# Patient Record
Sex: Female | Born: 1955 | Race: Black or African American | Hispanic: No | Marital: Married | Smoking: Never smoker
Health system: Southern US, Community
[De-identification: ages and names within clinical notes are randomized; demographics above are authoritative.]

## PROBLEM LIST (undated history)

## (undated) DIAGNOSIS — R112 Nausea with vomiting, unspecified: Secondary | ICD-10-CM

## (undated) DIAGNOSIS — M1990 Unspecified osteoarthritis, unspecified site: Secondary | ICD-10-CM

## (undated) DIAGNOSIS — N809 Endometriosis, unspecified: Secondary | ICD-10-CM

## (undated) DIAGNOSIS — K219 Gastro-esophageal reflux disease without esophagitis: Secondary | ICD-10-CM

## (undated) DIAGNOSIS — I341 Nonrheumatic mitral (valve) prolapse: Secondary | ICD-10-CM

## (undated) DIAGNOSIS — Z9889 Other specified postprocedural states: Secondary | ICD-10-CM

## (undated) DIAGNOSIS — I10 Essential (primary) hypertension: Secondary | ICD-10-CM

## (undated) DIAGNOSIS — D649 Anemia, unspecified: Secondary | ICD-10-CM

## (undated) HISTORY — DX: Essential (primary) hypertension: I10

## (undated) HISTORY — PX: TUBAL LIGATION: SHX77

## (undated) HISTORY — PX: COLONOSCOPY: SHX174

## (undated) HISTORY — PX: KNEE ARTHROSCOPY W/ MENISCAL REPAIR: SHX1877

## (undated) HISTORY — DX: Nonrheumatic mitral (valve) prolapse: I34.1

## (undated) HISTORY — PX: BUNIONECTOMY: SHX129

---

## 1978-03-27 HISTORY — PX: LEG SURGERY: SHX1003

## 1998-02-08 ENCOUNTER — Other Ambulatory Visit: Admission: RE | Admit: 1998-02-08 | Discharge: 1998-02-08 | Payer: Self-pay | Admitting: Obstetrics and Gynecology

## 1998-05-28 ENCOUNTER — Other Ambulatory Visit: Admission: RE | Admit: 1998-05-28 | Discharge: 1998-05-28 | Payer: Self-pay | Admitting: Obstetrics and Gynecology

## 1998-06-28 ENCOUNTER — Ambulatory Visit (HOSPITAL_COMMUNITY): Admission: RE | Admit: 1998-06-28 | Discharge: 1998-06-28 | Payer: Self-pay | Admitting: Obstetrics and Gynecology

## 2000-09-12 ENCOUNTER — Encounter: Admission: RE | Admit: 2000-09-12 | Discharge: 2000-09-12 | Payer: Self-pay | Admitting: Obstetrics and Gynecology

## 2000-09-12 ENCOUNTER — Encounter: Payer: Self-pay | Admitting: Obstetrics and Gynecology

## 2000-09-24 ENCOUNTER — Other Ambulatory Visit: Admission: RE | Admit: 2000-09-24 | Discharge: 2000-09-24 | Payer: Self-pay | Admitting: Obstetrics and Gynecology

## 2000-10-02 ENCOUNTER — Ambulatory Visit (HOSPITAL_COMMUNITY): Admission: RE | Admit: 2000-10-02 | Discharge: 2000-10-02 | Payer: Self-pay | Admitting: Obstetrics and Gynecology

## 2001-03-27 HISTORY — PX: PARTIAL KNEE ARTHROPLASTY: SHX2174

## 2001-06-06 ENCOUNTER — Encounter: Payer: Self-pay | Admitting: Family Medicine

## 2001-06-06 ENCOUNTER — Encounter: Admission: RE | Admit: 2001-06-06 | Discharge: 2001-06-06 | Payer: Self-pay | Admitting: Family Medicine

## 2001-07-15 ENCOUNTER — Encounter: Payer: Self-pay | Admitting: Family Medicine

## 2001-07-15 ENCOUNTER — Encounter: Admission: RE | Admit: 2001-07-15 | Discharge: 2001-07-15 | Payer: Self-pay | Admitting: Family Medicine

## 2003-04-03 ENCOUNTER — Encounter: Admission: RE | Admit: 2003-04-03 | Discharge: 2003-04-03 | Payer: Self-pay | Admitting: Obstetrics and Gynecology

## 2005-09-13 ENCOUNTER — Encounter: Admission: RE | Admit: 2005-09-13 | Discharge: 2005-09-13 | Payer: Self-pay | Admitting: Obstetrics and Gynecology

## 2006-12-10 ENCOUNTER — Encounter: Admission: RE | Admit: 2006-12-10 | Discharge: 2006-12-10 | Payer: Self-pay | Admitting: Obstetrics and Gynecology

## 2007-07-24 ENCOUNTER — Encounter: Admission: RE | Admit: 2007-07-24 | Discharge: 2007-07-24 | Payer: Self-pay | Admitting: Internal Medicine

## 2008-02-11 ENCOUNTER — Encounter: Admission: RE | Admit: 2008-02-11 | Discharge: 2008-02-11 | Payer: Self-pay | Admitting: Obstetrics and Gynecology

## 2009-03-15 ENCOUNTER — Encounter: Admission: RE | Admit: 2009-03-15 | Discharge: 2009-03-15 | Payer: Self-pay | Admitting: Obstetrics and Gynecology

## 2010-08-12 NOTE — Op Note (Signed)
[facility]  Patient:    Carol [name], Carol [name]                   MRN: [id number] Proc. Date: 10/02/00 Adm. Date:  [id number] Attending:  Malon Kindle                           Operative Report  PREOPERATIVE DIAGNOSIS:       Voluntary sterilization.  POSTOPERATIVE DIAGNOSES:      1. Voluntary sterilization.                               2. Pelvic endometriosis.  OPERATION:                    Open laparoscopic tubal cauterization.  SURGEON:                      Malachi Pro. Ambrose Mantle, M.D.  ANESTHESIA:                   General.  DESCRIPTION OF PROCEDURE:     The patient was brought to the operating room and placed under satisfactory general anesthesia, and placed in Allen stirrups.  The abdomen, vulva, vagina, and urethra were prepped with Betadine solution.  The bladder was emptied with a Jamaica catheter.  The examination revealed the uterus to be anterior, upper limits of normal size.  The adnexa were free of masses.  A Hulka cannula was placed into the uterus and attached to the anterior cervical lip.  The abdomen was then draped as a sterile field. A semilunar incision was made in the umbilicus and carried in layers down to the fascia.  The fascia was elevated with Kocher clamps.  It was incised, and I saw that I was in the peritoneal cavity through a little pin hole in the peritoneum.  I opened this incision more.  I placed two sutures of #0 Vicryl using a urology needle through the fascia, starting at 12 oclock, coming around from 12 to 6 clockwise, and then going from 12 to 6 counterclockwise. I then placed the Hasson cannula into the peritoneal cavity and secured the sutures down to the Hasson cannula.  Then with high flow, filled the abdominal cavity with CO2.  I then placed a smaller trocar suprapubically under direct vision, inserted the blunt probe, and elevated the uterus into the operative field, and made the following findings:   The uterus was anterior, upper limits of normal size, without obvious abnormality.  The posterior cul-de-sac was somewhat distorted but no actual lesions were noted in the cul-de-sac.  To the right of the cul-de-sac was an area close to the uterosacral ligament that was involved with black endometriosis.  Further lateral on the posterior broad ligament was probably a 4.0 mm area of solid black pigment, suggestive of endometriosis.  There was also a clear raised area above the initially described area of endometriosis that was probably secondary to endometriosis. Lateral to the left uterosacral ligament was some red endometriosis.  There were no adhesions here.  The anterior cul-de-sac was basically normal.  The right tube and ovary, and the left tube and ovary were normal.  I inspected the upper abdomen.  The liver was smooth.  I did not see any abnormalities.  I did not actually see the gallbladder.  I went across the  liver.  Both lobes appeared normal.  I then turned my attention to the tubes, and grasped the midportion of the right tube and cauterized it in three or four consecutive locations, going toward the uterine cornu until the meter always read zero.  This same procedure was conducted on the left tube.  At this point, I reinspected the pelvic cavity and found no problems.  I took out the lower abdominal trocar. There was no bleeding from the site.  I then released the pneumoperitoneum and removed the Hasson cannula and pulled each #0 Vicryl tight, and tied it to itself, and then tied across the midline to each other.  This closed the abdominal cavity.  I then used a #3-0 Vicryl to close the subcutaneous tissue of the periumbilical incision, and then closed the incision skin with #3-0 plain catgut.  I closed the lower abdominal incision with Steri-Strips.  The patient seemed to tolerate the procedure well.  Blood loss was less than 5 cc.  The sponge and needle counts were  correct.  The Hulka cannula was removed.  The patient was returned to the recovery room in satisfactory condition. DD:  10/02/00 TD:  10/02/00 Job: [id number]

## 2011-02-07 ENCOUNTER — Other Ambulatory Visit: Payer: Self-pay | Admitting: Obstetrics and Gynecology

## 2011-02-07 DIAGNOSIS — Z1231 Encounter for screening mammogram for malignant neoplasm of breast: Secondary | ICD-10-CM

## 2011-03-07 ENCOUNTER — Ambulatory Visit
Admission: RE | Admit: 2011-03-07 | Discharge: 2011-03-07 | Disposition: A | Discharge status: Home / Self Care | Payer: BC Managed Care – PPO | Source: Ambulatory Visit | Attending: Obstetrics and Gynecology | Admitting: Obstetrics and Gynecology

## 2011-03-07 DIAGNOSIS — Z1231 Encounter for screening mammogram for malignant neoplasm of breast: Secondary | ICD-10-CM

## 2011-11-21 ENCOUNTER — Ambulatory Visit (INDEPENDENT_AMBULATORY_CARE_PROVIDER_SITE_OTHER): Payer: BC Managed Care – PPO | Admitting: Family Medicine

## 2011-11-21 ENCOUNTER — Encounter: Payer: Self-pay | Admitting: Family Medicine

## 2011-11-21 VITALS — BP 124/88 | HR 87 | Ht 66.0 in | Wt 218.0 lb

## 2011-11-21 DIAGNOSIS — M1710 Unilateral primary osteoarthritis, unspecified knee: Secondary | ICD-10-CM

## 2011-11-21 DIAGNOSIS — M25562 Pain in left knee: Secondary | ICD-10-CM

## 2011-11-21 NOTE — Progress Notes (Signed)
  Subjective:    Patient ID: Carol [name], female    DOB: 06-10-1955, 56 y.o.   MRN: [id number]  PCP: Milus Height  HPI 56 yo F here for left knee pain.  Patient reports over 15 years of left knee pain. She previously has had left knee arthroscopy followed by partial knee replacement (she's unsure which compartment) then 2 more arthroscopies for partial meniscectomies. She's tried several things for her arthritis - cortisone shots, naproxen, celebrex, biofreeze, other natural topicals, advil, relative rest. Has never had viscosupplementation.   Has had discussion regarding total knee replacement but patient would like to exhaust all options before considering this.  Past Medical History  Diagnosis Date  . Hypertension   . MVP (mitral valve prolapse)     Current Outpatient Prescriptions on File Prior to Visit  Medication Sig Dispense Refill  . quinapril-hydrochlorothiazide (ACCURETIC) 10-12.5 MG per tablet         History reviewed. No pertinent past surgical history.  No Known Allergies  History   Social History  . Marital Status: Married    Spouse Name: N/A    Number of Children: N/A  . Years of Education: N/A   Occupational History  . Not on file.   Social History Main Topics  . Smoking status: Never Smoker   . Smokeless tobacco: Not on file  . Alcohol Use: Not on file  . Drug Use: Not on file  . Sexually Active: Not on file   Other Topics Concern  . Not on file   Social History Narrative  . No narrative on file    Family History  Problem Relation Age of Onset  . Hypertension Father   . Hyperlipidemia Neg Hx   . Sudden death Neg Hx   . Diabetes Other   . Heart attack Other   . Hypertension Other     BP 124/88  Pulse 87  Ht 5\' 6"  (1.676 m)  Wt 218 lb (98.884 kg)  BMI 35.19 kg/m2  Review of Systems See HPI above.    Objective:   Physical Exam Gen: NAD  L knee: Well healed surgical and port scars.  No effusion, ecchymoses, other  deformity. 1+ crepitus. Mild medial joint line TTP.  No lateral joint line post patellar facet, other knee TTP. ROM 0 - 120 degrees. Negative ant/post drawers. Negative valgus/varus testing. Negative lachmanns. Negative mcmurrays, apleys, patellar apprehension, clarkes. NV intact distally.  R knee: FROM without pain, instability.    Assessment & Plan:  1. 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  Discussed her nonsurgical options.  A few things she hasn't tried include viscosupplementation, capsaicin, glucosamine.  Discussed risks and benefits with these and she would like to try all of them.  See instructions for further regarding other treatments discussed.  After informed written consent, patient was seated on exam table. Left knee was prepped with alcohol swab and utilizing anterolateral approach, patient's left knee was injected intraarticularly with 3 mL marcaine and supartz. Patient tolerated the procedure well without immediate complications.

## 2011-11-21 NOTE — Patient Instructions (Addendum)
Tylenol 500mg  1-2 tabs three times a day for pain. Aleve 1-2 tabs twice a day with food *Glucosamine sulfate 750mg  twice a day is a supplement that may help moderate to severe arthritis. *Capsaicin topically up to four times a day may also help with pain. Cortisone injections are an option. *If cortisone injections do not help, there are different types of shots that may help but they take longer to take effect (viscosupplementation). It's important that you continue to stay active. Consider physical therapy to strengthen muscles around the joint that hurts to take pressure off of the joint itself. Shoe inserts with good arch support may be helpful. Walker or cane if needed. Heat or ice 15 minutes at a time 3-4 times a day as needed to help with pain. Water aerobics and cycling with low resistance are the best two types of exercise for arthritis. A knee brace may be helpful if your knee feels unstable due to pain. We will call you when the injection materials come in - you will need to come in every week for 3 weeks to get these.

## 2011-11-21 NOTE — Assessment & Plan Note (Signed)
2/2 DJD.  Discussed her nonsurgical options.  A few things she hasn't tried include viscosupplementation, capsaicin, glucosamine.  Discussed risks and benefits with these and she would like to try all of them.  See instructions for further regarding other treatments discussed.  After informed written consent, patient was seated on exam table. Left knee was prepped with alcohol swab and utilizing anterolateral approach, patient's left knee was injected intraarticularly with 3 mL marcaine and supartz. Patient tolerated the procedure well without immediate complications.

## 2011-11-28 ENCOUNTER — Ambulatory Visit (INDEPENDENT_AMBULATORY_CARE_PROVIDER_SITE_OTHER): Payer: BC Managed Care – PPO | Admitting: Family Medicine

## 2011-11-28 DIAGNOSIS — M25562 Pain in left knee: Secondary | ICD-10-CM

## 2011-11-28 DIAGNOSIS — M1710 Unilateral primary osteoarthritis, unspecified knee: Secondary | ICD-10-CM

## 2011-11-28 DIAGNOSIS — M25569 Pain in unspecified knee: Secondary | ICD-10-CM

## 2011-11-29 ENCOUNTER — Encounter: Payer: Self-pay | Admitting: Family Medicine

## 2011-11-29 NOTE — Progress Notes (Signed)
  Subjective:    Patient ID: Carol [name], female    DOB: 15-Mar-1956, 56 y.o.   MRN: [id number]  PCP: Milus Height  HPI  56 yo F here for f/u left knee pain.  8/27: Patient reports over 15 years of left knee pain. She previously has had left knee arthroscopy followed by partial knee replacement (she's unsure which compartment) then 2 more arthroscopies for partial meniscectomies. She's tried several things for her arthritis - cortisone shots, naproxen, celebrex, biofreeze, other natural topicals, advil, relative rest. Has never had viscosupplementation.   Has had discussion regarding total knee replacement but patient would like to exhaust all options before considering this.  9/3: Patient reports not much improvement since last visit. Returns today for second supartz injection. Brought radiographs from 2010 - showed she had a partial replacement of medial compartment of left knee - only a femoral component visualized on radiograph.  Past Medical History  Diagnosis Date  . Hypertension   . MVP (mitral valve prolapse)     Current Outpatient Prescriptions on File Prior to Visit  Medication Sig Dispense Refill  . amoxicillin (AMOXIL) 500 MG capsule       . quinapril-hydrochlorothiazide (ACCURETIC) 10-12.5 MG per tablet         History reviewed. No pertinent past surgical history.  No Known Allergies  History   Social History  . Marital Status: Married    Spouse Name: N/A    Number of Children: N/A  . Years of Education: N/A   Occupational History  . Not on file.   Social History Main Topics  . Smoking status: Never Smoker   . Smokeless tobacco: Not on file  . Alcohol Use: Not on file  . Drug Use: Not on file  . Sexually Active: Not on file   Other Topics Concern  . Not on file   Social History Narrative  . No narrative on file    Family History  Problem Relation Age of Onset  . Hypertension Father   . Hyperlipidemia Neg Hx   . Sudden death Neg Hx     . Diabetes Other   . Heart attack Other   . Hypertension Other     There were no vitals taken for this visit.  Review of Systems  See HPI above.    Objective:   Physical Exam  Gen: NAD  L knee: Well healed surgical and port scars.  No effusion, ecchymoses, other deformity. 1+ crepitus. Mild medial joint line TTP.  No lateral joint line post patellar facet, other knee TTP. ROM 0 - 120 degrees.  R knee: FROM without pain, instability.    Assessment & Plan:  1. 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  Second supartz given today.  Taking capsaicin, glucosamine.  Return in 1 week for 3rd injection.  After informed written consent, patient was seated on exam table. Left knee was prepped with alcohol swab and utilizing anterolateral approach, patient's left knee was injected intraarticularly with 3 mL marcaine and supartz. Patient tolerated the procedure well without immediate complications.

## 2011-11-29 NOTE — Assessment & Plan Note (Signed)
2/2 DJD.  Second supartz given today.  Taking capsaicin, glucosamine.  Return in 1 week for 3rd injection.  After informed written consent, patient was seated on exam table. Left knee was prepped with alcohol swab and utilizing anterolateral approach, patient's left knee was injected intraarticularly with 3 mL marcaine and supartz. Patient tolerated the procedure well without immediate complications.

## 2011-12-05 ENCOUNTER — Ambulatory Visit (INDEPENDENT_AMBULATORY_CARE_PROVIDER_SITE_OTHER): Payer: BC Managed Care – PPO | Admitting: Family Medicine

## 2011-12-05 ENCOUNTER — Encounter: Payer: Self-pay | Admitting: Family Medicine

## 2011-12-05 VITALS — BP 119/86 | HR 103 | Ht 66.0 in | Wt 214.0 lb

## 2011-12-05 DIAGNOSIS — M25569 Pain in unspecified knee: Secondary | ICD-10-CM

## 2011-12-05 DIAGNOSIS — M25562 Pain in left knee: Secondary | ICD-10-CM

## 2011-12-05 DIAGNOSIS — M1710 Unilateral primary osteoarthritis, unspecified knee: Secondary | ICD-10-CM

## 2011-12-05 DIAGNOSIS — IMO0002 Reserved for concepts with insufficient information to code with codable children: Secondary | ICD-10-CM

## 2011-12-05 DIAGNOSIS — M179 Osteoarthritis of knee, unspecified: Secondary | ICD-10-CM

## 2011-12-08 ENCOUNTER — Encounter: Payer: Self-pay | Admitting: Family Medicine

## 2011-12-08 DIAGNOSIS — M1710 Unilateral primary osteoarthritis, unspecified knee: Secondary | ICD-10-CM | POA: Insufficient documentation

## 2011-12-08 NOTE — Progress Notes (Signed)
  Subjective:    Patient ID: Carol [name], female    DOB: [birth year], 56 y.o.   MRN: [id number]  PCP: Milus Height  HPI  56 yo F here for f/u left knee pain.  8/27: Patient reports over 15 years of left knee pain. She previously has had left knee arthroscopy followed by partial knee replacement (she's unsure which compartment) then 2 more arthroscopies for partial meniscectomies. She's tried several things for her arthritis - cortisone shots, naproxen, celebrex, biofreeze, other natural topicals, advil, relative rest. Has never had viscosupplementation.   Has had discussion regarding total knee replacement but patient would like to exhaust all options before considering this.  9/3: Patient reports not much improvement since last visit. Returns today for second supartz injection. Brought radiographs from 2010 - showed she had a partial replacement of medial compartment of left knee - only a femoral component visualized on radiograph.  9/10: Feels about the same - maybe a little more pain since last visit. Here for final injection.  Past Medical History  Diagnosis Date  . Hypertension   . MVP (mitral valve prolapse)     Current Outpatient Prescriptions on File Prior to Visit  Medication Sig Dispense Refill  . amoxicillin (AMOXIL) 500 MG capsule       . quinapril-hydrochlorothiazide (ACCURETIC) 10-12.5 MG per tablet         History reviewed. No pertinent past surgical history.  No Known Allergies  History   Social History  . Marital Status: Married    Spouse Name: N/A    Number of Children: N/A  . Years of Education: N/A   Occupational History  . Not on file.   Social History Main Topics  . Smoking status: Never Smoker   . Smokeless tobacco: Not on file  . Alcohol Use: Not on file  . Drug Use: Not on file  . Sexually Active: Not on file   Other Topics Concern  . Not on file   Social History Narrative  . No narrative on file    Family History    Problem Relation Age of Onset  . Hypertension Father   . Hyperlipidemia Neg Hx   . Sudden death Neg Hx   . Diabetes Other   . Heart attack Other   . Hypertension Other     BP 119/86  Pulse 103  Ht 5\' 6"  (1.676 m)  Wt 214 lb (97.07 kg)  BMI 34.54 kg/m2  Review of Systems  See HPI above.    Objective:   Physical Exam  Gen: NAD  L knee: Well healed surgical and port scars.  No effusion, ecchymoses, other deformity. 1+ crepitus. Mild medial joint line TTP.  No lateral joint line post patellar facet, other knee TTP. ROM 0 - 120 degrees.  R knee: FROM without pain, instability.    Assessment & Plan:  1. 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  Third supartz given today.  Taking capsaicin, glucosamine.  F/u in 1 month for reevaluation.  If moderate benefit would consider fourth and fifth injections.  If no benefit would consider orthopedic surgeon referral to discuss TKR.  After informed written consent, patient was seated on exam table. Left knee was prepped with alcohol swab and utilizing anterolateral approach, patient's left knee was injected intraarticularly with 3 mL marcaine and supartz. Patient tolerated the procedure well without immediate complications.

## 2011-12-08 NOTE — Assessment & Plan Note (Signed)
2/2 DJD.  Third supartz given today.  Taking capsaicin, glucosamine.  F/u in 1 month for reevaluation.  If moderate benefit would consider fourth and fifth injections.  If no benefit would consider orthopedic surgeon referral to discuss TKR.  After informed written consent, patient was seated on exam table. Left knee was prepped with alcohol swab and utilizing anterolateral approach, patient's left knee was injected intraarticularly with 3 mL marcaine and supartz. Patient tolerated the procedure well without immediate complications.

## 2012-01-04 ENCOUNTER — Ambulatory Visit (INDEPENDENT_AMBULATORY_CARE_PROVIDER_SITE_OTHER): Payer: BC Managed Care – PPO | Admitting: Family Medicine

## 2012-01-04 ENCOUNTER — Encounter: Payer: Self-pay | Admitting: Family Medicine

## 2012-01-04 VITALS — BP 118/86 | HR 86 | Ht 66.0 in | Wt 211.0 lb

## 2012-01-04 DIAGNOSIS — M25569 Pain in unspecified knee: Secondary | ICD-10-CM

## 2012-01-04 DIAGNOSIS — M25562 Pain in left knee: Secondary | ICD-10-CM

## 2012-01-04 MED ORDER — MELOXICAM 15 MG PO TABS: 15.0000 mg | ORAL_TABLET | Freq: Every day | ORAL | Status: DC

## 2012-01-05 ENCOUNTER — Encounter: Payer: Self-pay | Admitting: Family Medicine

## 2012-01-05 NOTE — Assessment & Plan Note (Signed)
2/2 DJD.  S/p partial knee replacement (medial compartment).  After supartz series has not had benefit.  Third supartz given today.  At this point she's exhausted conservative measures for her arthritis.  Will refer her to ortho to talk about conversion of partial to total knee replacement.  She would like to see a different surgeon than the one who performed partial replacement.  Will arrange referral.

## 2012-01-05 NOTE — Progress Notes (Signed)
  Subjective:    Patient ID: Carol [name], female    DOB: 1955/09/17, 56 y.o.   MRN: [id number]  PCP: Milus Height  HPI  56 yo F here for f/u left knee pain.  8/27: Patient reports over 15 years of left knee pain. She previously has had left knee arthroscopy followed by partial knee replacement (she's unsure which compartment) then 2 more arthroscopies for partial meniscectomies. She's tried several things for her arthritis - cortisone shots, naproxen, celebrex, biofreeze, other natural topicals, advil, relative rest. Has never had viscosupplementation.   Has had discussion regarding total knee replacement but patient would like to exhaust all options before considering this.  9/3: Patient reports not much improvement since last visit. Returns today for second supartz injection. Brought radiographs from 2010 - showed she had a partial replacement of medial compartment of left knee - only a femoral component visualized on radiograph.  9/10: Feels about the same - maybe a little more pain since last visit. Here for final injection.  10/10: Patient reports that despite injection series of supartz she feels pain is the same as before injections. Not taking anything for pain currently.  Past Medical History  Diagnosis Date  . Hypertension   . MVP (mitral valve prolapse)     Current Outpatient Prescriptions on File Prior to Visit  Medication Sig Dispense Refill  . amoxicillin (AMOXIL) 500 MG capsule       . quinapril-hydrochlorothiazide (ACCURETIC) 10-12.5 MG per tablet         History reviewed. No pertinent past surgical history.  No Known Allergies  History   Social History  . Marital Status: Married    Spouse Name: N/A    Number of Children: N/A  . Years of Education: N/A   Occupational History  . Not on file.   Social History Main Topics  . Smoking status: Never Smoker   . Smokeless tobacco: Not on file  . Alcohol Use: Not on file  . Drug Use: Not on  file  . Sexually Active: Not on file   Other Topics Concern  . Not on file   Social History Narrative  . No narrative on file    Family History  Problem Relation Age of Onset  . Hypertension Father   . Hyperlipidemia Neg Hx   . Sudden death Neg Hx   . Diabetes Other   . Heart attack Other   . Hypertension Other     BP 118/86  Pulse 86  Ht 5\' 6"  (1.676 m)  Wt 211 lb (95.709 kg)  BMI 34.06 kg/m2  Review of Systems  See HPI above.    Objective:   Physical Exam  Gen: NAD Exam not repeated today. L knee: Well healed surgical and port scars.  No effusion, ecchymoses, other deformity. 1+ crepitus. Mild medial joint line TTP.  No lateral joint line post patellar facet, other knee TTP. ROM 0 - 120 degrees.  R knee: FROM without pain, instability.    Assessment & Plan:  1. 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  S/p partial knee replacement (medial compartment).  After supartz series has not had benefit.  Third supartz given today.  At this point she's exhausted conservative measures for her arthritis.  Will refer her to ortho to talk about conversion of partial to total knee replacement.  She would like to see a different surgeon than the one who performed partial replacement.  Will arrange referral.

## 2012-06-26 ENCOUNTER — Other Ambulatory Visit: Payer: Self-pay | Admitting: *Deleted

## 2012-06-26 ENCOUNTER — Other Ambulatory Visit: Payer: Self-pay | Admitting: Family Medicine

## 2012-06-26 DIAGNOSIS — M25562 Pain in left knee: Secondary | ICD-10-CM

## 2012-06-26 MED ORDER — MELOXICAM 15 MG PO TABS: 15.0000 mg | ORAL_TABLET | Freq: Every day | ORAL | Status: DC

## 2012-08-05 ENCOUNTER — Other Ambulatory Visit: Payer: Self-pay | Admitting: Orthopedic Surgery

## 2012-08-12 ENCOUNTER — Other Ambulatory Visit (HOSPITAL_BASED_OUTPATIENT_CLINIC_OR_DEPARTMENT_OTHER): Payer: Self-pay | Admitting: Obstetrics and Gynecology

## 2012-08-12 DIAGNOSIS — Z1231 Encounter for screening mammogram for malignant neoplasm of breast: Secondary | ICD-10-CM

## 2012-08-14 ENCOUNTER — Ambulatory Visit (HOSPITAL_BASED_OUTPATIENT_CLINIC_OR_DEPARTMENT_OTHER)
Admission: RE | Admit: 2012-08-14 | Discharge: 2012-08-14 | Disposition: A | Discharge status: Home / Self Care | Payer: BC Managed Care – PPO | Source: Ambulatory Visit | Attending: Obstetrics and Gynecology | Admitting: Obstetrics and Gynecology

## 2012-08-14 DIAGNOSIS — Z1231 Encounter for screening mammogram for malignant neoplasm of breast: Secondary | ICD-10-CM

## 2012-08-16 NOTE — Pre-Procedure Instructions (Signed)
ASHYRA CANTIN  08/16/2012   Your procedure is scheduled on:  Monday August 26, 2012.  Report to [facility] at 5:30 AM.  Call this number if you have problems the morning of surgery: [phone]   Remember:   Do not eat food or drink liquids after midnight.   Take these medicines the morning of surgery with A SIP OF WATER: Hydrocodone if needed for pain, and Pepcid if needed for heartburn   Do not wear jewelry, make-up or nail polish.  Do not wear lotions, powders, or perfumes.   Do not shave 48 hours prior to surgery.  Do not bring valuables to the hospital.  Contacts, dentures or bridgework may not be worn into surgery.  Leave suitcase in the car. After surgery it may be brought to your room.  For patients admitted to the hospital, checkout time is 11:00 AM the day of discharge.   Patients discharged the day of surgery will not be allowed to drive home.  Name and phone number of your driver: Family/Friend  Special Instructions: Shower using CHG 2 nights before surgery and the night before surgery.  If you shower the day of surgery use CHG.  Use special wash - you have one bottle of CHG for all showers.  You should use approximately 1/3 of the bottle for each shower.   Please read over the following fact sheets that you were given: Pain Booklet, Coughing and Deep Breathing, Blood Transfusion Information, MRSA Information and Surgical Site Infection Prevention

## 2012-08-20 ENCOUNTER — Encounter (HOSPITAL_COMMUNITY)
Admission: RE | Admit: 2012-08-20 | Discharge: 2012-08-20 | Disposition: A | Discharge status: Home / Self Care | Payer: BC Managed Care – PPO | Source: Ambulatory Visit | Attending: Orthopedic Surgery | Admitting: Orthopedic Surgery

## 2012-08-20 ENCOUNTER — Other Ambulatory Visit: Payer: Self-pay | Admitting: Orthopedic Surgery

## 2012-08-20 ENCOUNTER — Encounter (HOSPITAL_COMMUNITY): Payer: Self-pay | Admitting: Pharmacy Technician

## 2012-08-20 ENCOUNTER — Encounter (HOSPITAL_COMMUNITY): Payer: Self-pay

## 2012-08-20 DIAGNOSIS — Z01818 Encounter for other preprocedural examination: Secondary | ICD-10-CM | POA: Insufficient documentation

## 2012-08-20 DIAGNOSIS — Z01810 Encounter for preprocedural cardiovascular examination: Secondary | ICD-10-CM | POA: Insufficient documentation

## 2012-08-20 DIAGNOSIS — Z01812 Encounter for preprocedural laboratory examination: Secondary | ICD-10-CM | POA: Insufficient documentation

## 2012-08-20 DIAGNOSIS — Z01811 Encounter for preprocedural respiratory examination: Secondary | ICD-10-CM | POA: Insufficient documentation

## 2012-08-20 HISTORY — DX: Nausea with vomiting, unspecified: R11.2

## 2012-08-20 HISTORY — DX: Endometriosis, unspecified: N80.9

## 2012-08-20 HISTORY — DX: Gastro-esophageal reflux disease without esophagitis: K21.9

## 2012-08-20 HISTORY — DX: Other specified postprocedural states: Z98.890

## 2012-08-20 HISTORY — DX: Unspecified osteoarthritis, unspecified site: M19.90

## 2012-08-20 LAB — BASIC METABOLIC PANEL
CO2: 26 mEq/L (ref 19–32)
Calcium: 9.8 mg/dL (ref 8.4–10.5)
Creatinine, Ser: 0.82 mg/dL (ref 0.50–1.10)
GFR calc Af Amer: >90 mL/min (ref >90)
Sodium: 142 mEq/L (ref 135–145)

## 2012-08-20 LAB — TYPE AND SCREEN
ABO/RH(D): O POS
Antibody Screen: NEGATIVE

## 2012-08-20 LAB — CBC WITH DIFFERENTIAL/PLATELET
Basophils Absolute: 0 10*3/uL (ref 0.0–0.1)
Lymphocytes Relative: 26 % (ref 12–46)
Lymphs Abs: 1.5 10*3/uL (ref 0.7–4.0)
Neutro Abs: 3.9 10*3/uL (ref 1.7–7.7)
Neutrophils Relative %: 67 % (ref 43–77)
Platelets: 246 10*3/uL (ref 150–400)
RBC: 4.25 MIL/uL (ref 3.87–5.11)
RDW: 12.6 % (ref 11.5–15.5)
WBC: 5.8 10*3/uL (ref 4.0–10.5)

## 2012-08-20 LAB — URINALYSIS, ROUTINE W REFLEX MICROSCOPIC
Hgb urine dipstick: NEGATIVE
Ketones, ur: NEGATIVE mg/dL
Specific Gravity, Urine: 1.028 (ref 1.005–1.030)
Urobilinogen, UA: 1 mg/dL (ref 0.0–1.0)

## 2012-08-20 LAB — PROTIME-INR
INR: 0.99 (ref 0.00–1.49)
Prothrombin Time: 13 seconds (ref 11.6–15.2)

## 2012-08-20 LAB — SURGICAL PCR SCREEN
MRSA, PCR: NEGATIVE
Staphylococcus aureus: NEGATIVE

## 2012-08-20 LAB — URINE MICROSCOPIC-ADD ON

## 2012-08-20 LAB — ABO/RH: ABO/RH(D): O POS

## 2012-08-20 LAB — APTT: aPTT: 31 seconds (ref 24–37)

## 2012-08-20 NOTE — Progress Notes (Signed)
Nurse called and left a voicemail with Olegario Messier at Dr. Wadie Lessen office and informed her that orders were in [facility] however there was no order for consent. Direct call back number left.

## 2012-08-20 NOTE — Progress Notes (Signed)
Patient informed Nurse that she had a stress test at [facility] several years ago, but it was determined it was due to "acid reflux." Will request stress test report. PCP is Elesa Hacker.

## 2012-08-21 LAB — URINE CULTURE: Colony Count: >=100000

## 2012-08-21 NOTE — Progress Notes (Signed)
Anesthesia Chart Review:  Patient is a 57 year old female scheduled for revision of left TKA on 08/26/12 by Dr. Turner Daniels.  History includes obesity, non-smoker, HTN, MVP, GERD, arthritis, endometriosis, post-operative N/V.  PCP is Dr. Maurice Small.  EKG on 08/20/12 showed NSR, cannot rule out anterior infarct (age undetermined).  Currently, there are no comparison EKGs available.  She reported a stress test at [facility]) several years ago (> 5 years per PAT RN) that was okay, and symptoms were felt related to reflux.  Stress, echo, and EKG from [facility] were requested as available, but medical records said none of these studies were on file at their hospital. No CV symptoms were documented at her PAT visit.  She has no known history of MI/CHF or DM.   CXR on 08/20/12 showed slightly lower lung volumes with an otherwise stable cardiopulmonary appearance and no new focal or acute abnormality.   Preoperative labs noted.  Urine culture showed > 100,000 colonies of E. Coli. Agustin Cree at Dr. Wadie Lessen office notified of urine culture results.  She contacted patient's PCP office and requested that they call in an antibiotic for her UTI.  She will be evaluated by her assigned anesthesiologist on the day of surgery.  If she remains asymptomatic from a CV standpoint then I would anticipate that she could proceed as planned.  Velna Ochs [facility]iology Phone [phone] 08/21/2012 10:15 AM
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
## 2012-08-25 MED ORDER — CEFAZOLIN SODIUM-DEXTROSE 2-3 GM-% IV SOLR
2.0000 g | INTRAVENOUS | Status: AC
  Administered 2012-08-26: 2 g via INTRAVENOUS
  Filled 2012-08-25: qty 50

## 2012-08-26 ENCOUNTER — Encounter (HOSPITAL_COMMUNITY): Payer: Self-pay | Admitting: Vascular Surgery

## 2012-08-26 ENCOUNTER — Encounter (HOSPITAL_COMMUNITY)
Admission: RE | Disposition: A | Discharge status: Home / Self Care | Payer: Self-pay | Source: Ambulatory Visit | Attending: Orthopedic Surgery

## 2012-08-26 ENCOUNTER — Ambulatory Visit (HOSPITAL_COMMUNITY): Payer: BC Managed Care – PPO | Admitting: Anesthesiology

## 2012-08-26 ENCOUNTER — Encounter (HOSPITAL_COMMUNITY): Payer: Self-pay | Admitting: *Deleted

## 2012-08-26 DIAGNOSIS — T8484XA Pain due to internal orthopedic prosthetic devices, implants and grafts, initial encounter: Secondary | ICD-10-CM

## 2012-08-26 DIAGNOSIS — I10 Essential (primary) hypertension: Secondary | ICD-10-CM | POA: Diagnosis present

## 2012-08-26 DIAGNOSIS — K219 Gastro-esophageal reflux disease without esophagitis: Secondary | ICD-10-CM | POA: Diagnosis present

## 2012-08-26 DIAGNOSIS — Y838 Other surgical procedures as the cause of abnormal reaction of the patient, or of later complication, without mention of misadventure at the time of the procedure: Secondary | ICD-10-CM | POA: Diagnosis present

## 2012-08-26 DIAGNOSIS — Z7982 Long term (current) use of aspirin: Secondary | ICD-10-CM

## 2012-08-26 DIAGNOSIS — T84099A Other mechanical complication of unspecified internal joint prosthesis, initial encounter: Principal | ICD-10-CM | POA: Diagnosis present

## 2012-08-26 DIAGNOSIS — I059 Rheumatic mitral valve disease, unspecified: Secondary | ICD-10-CM | POA: Diagnosis present

## 2012-08-26 DIAGNOSIS — M1710 Unilateral primary osteoarthritis, unspecified knee: Secondary | ICD-10-CM | POA: Diagnosis present

## 2012-08-26 DIAGNOSIS — Z96659 Presence of unspecified artificial knee joint: Secondary | ICD-10-CM

## 2012-08-26 DIAGNOSIS — D649 Anemia, unspecified: Secondary | ICD-10-CM | POA: Diagnosis present

## 2012-08-26 HISTORY — PX: TOTAL KNEE REVISION: SHX996

## 2012-08-26 HISTORY — DX: Anemia, unspecified: D64.9

## 2012-08-26 SURGERY — TOTAL KNEE REVISION
Anesthesia: General | Site: Knee | Laterality: Left | Wound class: Clean

## 2012-08-26 MED ORDER — OXYCODONE HCL 5 MG/5ML PO SOLN: 5.0000 mg | Freq: Once | ORAL | Status: DC | PRN

## 2012-08-26 MED ORDER — ONDANSETRON HCL 4 MG PO TABS: 4.0000 mg | ORAL_TABLET | Freq: Four times a day (QID) | ORAL | Status: DC | PRN

## 2012-08-26 MED ORDER — HYDROMORPHONE HCL PF 1 MG/ML IJ SOLN
1.0000 mg | INTRAMUSCULAR | Status: DC | PRN
  Administered 2012-08-26: 1 mg via INTRAVENOUS
  Filled 2012-08-26: qty 1

## 2012-08-26 MED ORDER — CHLORHEXIDINE GLUCONATE 4 % EX LIQD: 60.0000 mL | Freq: Once | CUTANEOUS | Status: DC

## 2012-08-26 MED ORDER — GLYCOPYRROLATE 0.2 MG/ML IJ SOLN
INTRAMUSCULAR | Status: DC | PRN
  Administered 2012-08-26: 0.4 mg via INTRAVENOUS

## 2012-08-26 MED ORDER — ZOLPIDEM TARTRATE 5 MG PO TABS: 5.0000 mg | ORAL_TABLET | Freq: Every evening | ORAL | Status: DC | PRN

## 2012-08-26 MED ORDER — ASPIRIN 325 MG PO TBEC: 325.0000 mg | DELAYED_RELEASE_TABLET | Freq: Two times a day (BID) | ORAL | Status: DC

## 2012-08-26 MED ORDER — NEOSTIGMINE METHYLSULFATE 1 MG/ML IJ SOLN
INTRAMUSCULAR | Status: DC | PRN
  Administered 2012-08-26: 3 mg via INTRAVENOUS

## 2012-08-26 MED ORDER — MAGNESIUM HYDROXIDE 400 MG/5ML PO SUSP: 30.0000 mL | Freq: Every day | ORAL | Status: DC | PRN

## 2012-08-26 MED ORDER — METHOCARBAMOL 500 MG PO TABS
500.0000 mg | ORAL_TABLET | Freq: Four times a day (QID) | ORAL | Status: DC | PRN
  Administered 2012-08-27 – 2012-08-28 (×4): 500 mg via ORAL
  Filled 2012-08-26 (×6): qty 1

## 2012-08-26 MED ORDER — SODIUM CHLORIDE 0.9 % IJ SOLN
INTRAMUSCULAR | Status: DC | PRN
  Administered 2012-08-26: 08:00:00

## 2012-08-26 MED ORDER — HYDROMORPHONE HCL PF 1 MG/ML IJ SOLN
INTRAMUSCULAR | Status: AC
  Filled 2012-08-26: qty 1

## 2012-08-26 MED ORDER — ACETAMINOPHEN 650 MG RE SUPP: 650.0000 mg | Freq: Four times a day (QID) | RECTAL | Status: DC | PRN

## 2012-08-26 MED ORDER — FLEET ENEMA 7-19 GM/118ML RE ENEM: 1.0000 | ENEMA | Freq: Once | RECTAL | Status: AC | PRN

## 2012-08-26 MED ORDER — FENTANYL CITRATE 0.05 MG/ML IJ SOLN
INTRAMUSCULAR | Status: DC | PRN
  Administered 2012-08-26: 50 ug via INTRAVENOUS
  Administered 2012-08-26: 100 ug via INTRAVENOUS
  Administered 2012-08-26: 50 ug via INTRAVENOUS
  Administered 2012-08-26: 100 ug via INTRAVENOUS
  Administered 2012-08-26: 50 ug via INTRAVENOUS
  Administered 2012-08-26: 150 ug via INTRAVENOUS

## 2012-08-26 MED ORDER — SULFAMETHOXAZOLE-TMP DS 800-160 MG PO TABS
1.0000 | ORAL_TABLET | Freq: Two times a day (BID) | ORAL | Status: DC
  Administered 2012-08-26 – 2012-08-28 (×5): 1 via ORAL
  Filled 2012-08-26 (×6): qty 1

## 2012-08-26 MED ORDER — HYDROCHLOROTHIAZIDE 25 MG PO TABS
25.0000 mg | ORAL_TABLET | Freq: Every day | ORAL | Status: DC
  Administered 2012-08-26 – 2012-08-28 (×2): 25 mg via ORAL
  Filled 2012-08-26 (×3): qty 1

## 2012-08-26 MED ORDER — ACETAMINOPHEN 325 MG PO TABS: 650.0000 mg | ORAL_TABLET | Freq: Four times a day (QID) | ORAL | Status: DC | PRN

## 2012-08-26 MED ORDER — METHOCARBAMOL 500 MG PO TABS: 500.0000 mg | ORAL_TABLET | Freq: Four times a day (QID) | ORAL | Status: DC | PRN

## 2012-08-26 MED ORDER — ONDANSETRON HCL 4 MG/2ML IJ SOLN
INTRAMUSCULAR | Status: DC | PRN
  Administered 2012-08-26 (×2): 4 mg via INTRAVENOUS

## 2012-08-26 MED ORDER — BISACODYL 5 MG PO TBEC: 5.0000 mg | DELAYED_RELEASE_TABLET | Freq: Every day | ORAL | Status: DC | PRN

## 2012-08-26 MED ORDER — ASPIRIN EC 325 MG PO TBEC
325.0000 mg | DELAYED_RELEASE_TABLET | Freq: Two times a day (BID) | ORAL | Status: DC
  Administered 2012-08-26 – 2012-08-28 (×5): 325 mg via ORAL
  Filled 2012-08-26 (×6): qty 1

## 2012-08-26 MED ORDER — LISINOPRIL 10 MG PO TABS
10.0000 mg | ORAL_TABLET | Freq: Every day | ORAL | Status: DC
  Administered 2012-08-26 – 2012-08-28 (×2): 10 mg via ORAL
  Filled 2012-08-26 (×3): qty 1

## 2012-08-26 MED ORDER — PROMETHAZINE HCL 25 MG/ML IJ SOLN: 6.2500 mg | INTRAMUSCULAR | Status: DC | PRN

## 2012-08-26 MED ORDER — CEFUROXIME SODIUM 1.5 G IJ SOLR
INTRAMUSCULAR | Status: DC | PRN
  Administered 2012-08-26: 1.5 g

## 2012-08-26 MED ORDER — SUCCINYLCHOLINE CHLORIDE 20 MG/ML IJ SOLN
INTRAMUSCULAR | Status: DC | PRN
  Administered 2012-08-26: 100 mg via INTRAVENOUS

## 2012-08-26 MED ORDER — OXYCODONE HCL 5 MG PO TABS
5.0000 mg | ORAL_TABLET | ORAL | Status: DC | PRN
  Administered 2012-08-26 – 2012-08-28 (×10): 10 mg via ORAL
  Filled 2012-08-26 (×10): qty 2

## 2012-08-26 MED ORDER — PHENOL 1.4 % MT LIQD: 1.0000 | OROMUCOSAL | Status: DC | PRN

## 2012-08-26 MED ORDER — QUINAPRIL-HYDROCHLOROTHIAZIDE 10-12.5 MG PO TABS: 1.0000 | ORAL_TABLET | Freq: Every day | ORAL | Status: DC

## 2012-08-26 MED ORDER — LIDOCAINE HCL (CARDIAC) 20 MG/ML IV SOLN
INTRAVENOUS | Status: DC | PRN
  Administered 2012-08-26: 70 mg via INTRAVENOUS

## 2012-08-26 MED ORDER — BUPIVACAINE HCL 0.5 % IJ SOLN
INTRAMUSCULAR | Status: DC | PRN
  Administered 2012-08-26: 18 mL

## 2012-08-26 MED ORDER — CELECOXIB 200 MG PO CAPS
200.0000 mg | ORAL_CAPSULE | Freq: Two times a day (BID) | ORAL | Status: DC
  Administered 2012-08-26 – 2012-08-28 (×5): 200 mg via ORAL
  Filled 2012-08-26 (×6): qty 1

## 2012-08-26 MED ORDER — BUPIVACAINE HCL (PF) 0.5 % IJ SOLN
INTRAMUSCULAR | Status: AC
  Filled 2012-08-26: qty 30

## 2012-08-26 MED ORDER — BUPIVACAINE LIPOSOME 1.3 % IJ SUSP
20.0000 mL | Freq: Once | INTRAMUSCULAR | Status: DC
  Filled 2012-08-26: qty 20

## 2012-08-26 MED ORDER — METOCLOPRAMIDE HCL 5 MG/ML IJ SOLN: 5.0000 mg | Freq: Three times a day (TID) | INTRAMUSCULAR | Status: DC | PRN

## 2012-08-26 MED ORDER — ROCURONIUM BROMIDE 100 MG/10ML IV SOLN
INTRAVENOUS | Status: DC | PRN
  Administered 2012-08-26: 50 mg via INTRAVENOUS

## 2012-08-26 MED ORDER — MEPERIDINE HCL 25 MG/ML IJ SOLN: 6.2500 mg | INTRAMUSCULAR | Status: DC | PRN

## 2012-08-26 MED ORDER — METOCLOPRAMIDE HCL 10 MG PO TABS: 5.0000 mg | ORAL_TABLET | Freq: Three times a day (TID) | ORAL | Status: DC | PRN

## 2012-08-26 MED ORDER — DIPHENHYDRAMINE HCL 12.5 MG/5ML PO ELIX: 12.5000 mg | ORAL_SOLUTION | ORAL | Status: DC | PRN

## 2012-08-26 MED ORDER — CEFUROXIME SODIUM 1.5 G IJ SOLR
INTRAMUSCULAR | Status: AC
  Filled 2012-08-26: qty 1.5

## 2012-08-26 MED ORDER — SCOPOLAMINE 1 MG/3DAYS TD PT72
MEDICATED_PATCH | TRANSDERMAL | Status: AC
  Administered 2012-08-26: 1 via TRANSDERMAL
  Filled 2012-08-26: qty 1

## 2012-08-26 MED ORDER — PROPOFOL 10 MG/ML IV BOLUS
INTRAVENOUS | Status: DC | PRN
  Administered 2012-08-26: 150 mg via INTRAVENOUS

## 2012-08-26 MED ORDER — HYDROMORPHONE HCL PF 1 MG/ML IJ SOLN
0.2500 mg | INTRAMUSCULAR | Status: DC | PRN
  Administered 2012-08-26 (×2): 0.5 mg via INTRAVENOUS

## 2012-08-26 MED ORDER — DEXAMETHASONE SODIUM PHOSPHATE 4 MG/ML IJ SOLN
INTRAMUSCULAR | Status: DC | PRN
  Administered 2012-08-26: 12 mg via INTRAVENOUS

## 2012-08-26 MED ORDER — LACTATED RINGERS IV SOLN
INTRAVENOUS | Status: DC | PRN
  Administered 2012-08-26 (×3): via INTRAVENOUS

## 2012-08-26 MED ORDER — FAMOTIDINE 20 MG PO TABS
20.0000 mg | ORAL_TABLET | Freq: Two times a day (BID) | ORAL | Status: DC | PRN
  Filled 2012-08-26: qty 1

## 2012-08-26 MED ORDER — ONDANSETRON HCL 4 MG/2ML IJ SOLN: 4.0000 mg | Freq: Four times a day (QID) | INTRAMUSCULAR | Status: DC | PRN

## 2012-08-26 MED ORDER — MIDAZOLAM HCL 5 MG/5ML IJ SOLN
INTRAMUSCULAR | Status: DC | PRN
  Administered 2012-08-26: 2 mg via INTRAVENOUS

## 2012-08-26 MED ORDER — OXYCODONE-ACETAMINOPHEN 5-325 MG PO TABS: 1.0000 | ORAL_TABLET | ORAL | Status: DC | PRN

## 2012-08-26 MED ORDER — KCL IN DEXTROSE-NACL 20-5-0.45 MEQ/L-%-% IV SOLN
INTRAVENOUS | Status: DC
Start: 2012-08-26 — End: 2012-08-28
  Administered 2012-08-26: 16:00:00 via INTRAVENOUS
  Filled 2012-08-26 (×8): qty 1000

## 2012-08-26 MED ORDER — SODIUM CHLORIDE 0.9 % IR SOLN
Status: DC | PRN
  Administered 2012-08-26: 3000 mL

## 2012-08-26 MED ORDER — METHOCARBAMOL 100 MG/ML IJ SOLN: 500.0000 mg | Freq: Four times a day (QID) | INTRAMUSCULAR | Status: DC | PRN

## 2012-08-26 MED ORDER — OXYCODONE HCL 5 MG PO TABS: 5.0000 mg | ORAL_TABLET | Freq: Once | ORAL | Status: DC | PRN

## 2012-08-26 MED ORDER — MENTHOL 3 MG MT LOZG: 1.0000 | LOZENGE | OROMUCOSAL | Status: DC | PRN

## 2012-08-26 MED ORDER — ALUM & MAG HYDROXIDE-SIMETH 200-200-20 MG/5ML PO SUSP: 30.0000 mL | ORAL | Status: DC | PRN

## 2012-08-26 MED ORDER — DEXTROSE-NACL 5-0.45 % IV SOLN: INTRAVENOUS | Status: DC

## 2012-08-26 SURGICAL SUPPLY — 62 items
BANDAGE ELASTIC 4 VELCRO ST LF (GAUZE/BANDAGES/DRESSINGS) ×2 IMPLANT
BANDAGE ELASTIC 6 VELCRO ST LF (GAUZE/BANDAGES/DRESSINGS) ×2 IMPLANT
BANDAGE ESMARK 6X9 LF (GAUZE/BANDAGES/DRESSINGS) ×1 IMPLANT
BLADE SAG 18X100X1.27 (BLADE) ×2 IMPLANT
BLADE SAW SAG 90X13X1.27 (BLADE) ×2 IMPLANT
BLADE SURG ROTATE 9660 (MISCELLANEOUS) IMPLANT
BNDG CMPR 9X6 STRL LF SNTH (GAUZE/BANDAGES/DRESSINGS) ×1
BNDG ESMARK 6X9 LF (GAUZE/BANDAGES/DRESSINGS) ×2
BOWL SMART MIX CTS (DISPOSABLE) ×2 IMPLANT
CEMENT HV SMART SET (Cement) ×4 IMPLANT
CLOTH BEACON ORANGE TIMEOUT ST (SAFETY) ×2 IMPLANT
COVER BACK TABLE 24X17X13 BIG (DRAPES) IMPLANT
COVER SURGICAL LIGHT HANDLE (MISCELLANEOUS) ×2 IMPLANT
CUFF TOURNIQUET SINGLE 34IN LL (TOURNIQUET CUFF) ×2 IMPLANT
CUFF TOURNIQUET SINGLE 44IN (TOURNIQUET CUFF) IMPLANT
DISC DIAMOND MED (BURR) IMPLANT
DRAPE EXTREMITY T 121X128X90 (DRAPE) ×2 IMPLANT
DRAPE U-SHAPE 47X51 STRL (DRAPES) ×2 IMPLANT
DRSG PAD ABDOMINAL 8X10 ST (GAUZE/BANDAGES/DRESSINGS) ×2 IMPLANT
DURAPREP 26ML APPLICATOR (WOUND CARE) ×2 IMPLANT
ELECT REM PT RETURN 9FT ADLT (ELECTROSURGICAL) ×2
ELECTRODE REM PT RTRN 9FT ADLT (ELECTROSURGICAL) ×1 IMPLANT
EVACUATOR 1/8 PVC DRAIN (DRAIN) ×2 IMPLANT
GAUZE XEROFORM 1X8 LF (GAUZE/BANDAGES/DRESSINGS) ×2 IMPLANT
GLOVE BIO SURGEON STRL SZ7 (GLOVE) ×2 IMPLANT
GLOVE BIO SURGEON STRL SZ7.5 (GLOVE) ×2 IMPLANT
GLOVE BIOGEL PI IND STRL 7.0 (GLOVE) ×1 IMPLANT
GLOVE BIOGEL PI IND STRL 8 (GLOVE) ×1 IMPLANT
GLOVE BIOGEL PI INDICATOR 7.0 (GLOVE) ×1
GLOVE BIOGEL PI INDICATOR 8 (GLOVE) ×1
GOWN PREVENTION PLUS XLARGE (GOWN DISPOSABLE) ×2 IMPLANT
GOWN STRL NON-REIN LRG LVL3 (GOWN DISPOSABLE) ×4 IMPLANT
HANDPIECE INTERPULSE COAX TIP (DISPOSABLE) ×1
HOOD PEEL AWAY FACE SHEILD DIS (HOOD) ×6 IMPLANT
KIT BASIN OR (CUSTOM PROCEDURE TRAY) ×2 IMPLANT
KIT ROOM TURNOVER OR (KITS) ×2 IMPLANT
MANIFOLD NEPTUNE II (INSTRUMENTS) ×2 IMPLANT
NEEDLE HYPO 21X1 ECLIPSE (NEEDLE) ×2 IMPLANT
NS IRRIG 1000ML POUR BTL (IV SOLUTION) ×2 IMPLANT
PACK TOTAL JOINT (CUSTOM PROCEDURE TRAY) ×2 IMPLANT
PAD ARMBOARD 7.5X6 YLW CONV (MISCELLANEOUS) ×4 IMPLANT
PAD CAST 4YDX4 CTTN HI CHSV (CAST SUPPLIES) ×1 IMPLANT
PADDING CAST COTTON 4X4 STRL (CAST SUPPLIES) ×1
PADDING CAST COTTON 6X4 STRL (CAST SUPPLIES) ×2 IMPLANT
PENCIL BUTTON HOLSTER BLD 10FT (ELECTRODE) ×2 IMPLANT
RASP HELIOCORDIAL MED (MISCELLANEOUS) IMPLANT
SET HNDPC FAN SPRY TIP SCT (DISPOSABLE) ×1 IMPLANT
SPONGE GAUZE 4X4 12PLY (GAUZE/BANDAGES/DRESSINGS) ×2 IMPLANT
STAPLER VISISTAT 35W (STAPLE) ×2 IMPLANT
SUCTION FRAZIER TIP 10 FR DISP (SUCTIONS) ×2 IMPLANT
SUT VIC AB 0 CT1 27 (SUTURE) ×1
SUT VIC AB 0 CT1 27XBRD ANBCTR (SUTURE) ×1 IMPLANT
SUT VIC AB 1 CTX 36 (SUTURE) ×2
SUT VIC AB 1 CTX36XBRD ANBCTR (SUTURE) ×1 IMPLANT
SUT VIC AB 2-0 CT1 27 (SUTURE) ×2
SUT VIC AB 2-0 CT1 TAPERPNT 27 (SUTURE) ×1 IMPLANT
SYR 20CC LL (SYRINGE) ×2 IMPLANT
TOWEL OR 17X24 6PK STRL BLUE (TOWEL DISPOSABLE) ×2 IMPLANT
TOWEL OR 17X26 10 PK STRL BLUE (TOWEL DISPOSABLE) ×2 IMPLANT
TRAY FOLEY CATH 14FR (SET/KITS/TRAYS/PACK) ×2 IMPLANT
TUBE ANAEROBIC SPECIMEN COL (MISCELLANEOUS) ×2 IMPLANT
WATER STERILE IRR 1000ML POUR (IV SOLUTION) ×6 IMPLANT

## 2012-08-26 NOTE — Evaluation (Signed)
Physical Therapy Evaluation Patient Details Name: Carol [name] MRN: [id number] DOB: 08-Jan-1956 Today's Date: 08/26/2012 Time: 1450-1510 PT Time Calculation (min): 20 min  PT Assessment / Plan / Recommendation Clinical Impression  Patient is a 57 yo female s/p Lt. TKA.  Will benefit from acute PT to maximize independence prior to return home with husband.  Recommend HHPT at [facility].    PT Assessment  Patient needs continued PT services    Follow Up Recommendations  Home health PT;Supervision/Assistance - 24 hour    Does the patient have the potential to tolerate intense rehabilitation      Barriers to Discharge None      Equipment Recommendations  None recommended by PT    Recommendations for Other Services     Frequency 7X/week    Precautions / Restrictions Precautions Precautions: Knee Precaution Booklet Issued: Yes (comment) Precaution Comments: Reviewed precautions with patient. Restrictions Weight Bearing Restrictions: Yes LLE Weight Bearing: Weight bearing as tolerated   Pertinent Vitals/Pain      Mobility  Bed Mobility Bed Mobility: Supine to Sit;Sitting - Scoot to Edge of Bed Supine to Sit: 4: Min assist;HOB flat Sitting - Scoot to [place] of Bed: 4: Min assist Details for Bed Mobility Assistance: Verbal cues for technique.  Assist to move LLE off of bed. Transfers Transfers: Sit to Stand;Stand to [employer] Transfers Sit to Stand: 4: Min assist;With upper extremity assist;From bed Stand to Sit: 4: Min assist;With upper extremity assist;With armrests;To chair/3-in-1 Stand Pivot Transfers: 4: Min assist Details for Transfer Assistance: Verbal cues for hand placement and placement of LLE during transfers.  Patient sat EOB x 6 minutes with good balance.  Patient able to take several steps to pivot to chair. Ambulation/Gait Ambulation/Gait Assistance: Not tested (comment)    Exercises Total Joint Exercises Ankle Circles/Pumps: AROM;Both;10  reps;Seated   PT Diagnosis: Difficulty walking;Acute pain  PT Problem List: Decreased strength;Decreased range of motion;Decreased activity tolerance;Decreased balance;Decreased mobility;Decreased knowledge of use of DME;Decreased knowledge of precautions;Pain PT Treatment Interventions: DME instruction;Gait training;Stair training;Functional mobility training;Therapeutic exercise;Patient/family education   PT Goals Acute Rehab PT Goals PT Goal Formulation: With patient Time For Goal Achievement: 09/02/12 Potential to Achieve Goals: Good Pt will go Supine/Side to Sit: with supervision;with HOB 0 degrees PT Goal: Supine/Side to Sit - Progress: Goal set today Pt will go Sit to Supine/Side: with supervision;with HOB 0 degrees PT Goal: Sit to Supine/Side - Progress: Goal set today Pt will go Sit to Stand: with supervision;with upper extremity assist PT Goal: Sit to Stand - Progress: Goal set today Pt will Ambulate: 51 - 150 feet;with supervision;with rolling walker PT Goal: Ambulate - Progress: Goal set today Pt will Go Up / Down Stairs: 1-2 stairs;with min assist;with least restrictive assistive device PT Goal: Up/Down Stairs - Progress: Goal set today Pt will Perform Home Exercise Program: with supervision, verbal cues required/provided PT Goal: Perform Home Exercise Program - Progress: Goal set today  Visit Information  Last PT Received On: 08/26/12 Assistance Needed: +1    Subjective Data  Subjective: "I'm feeling a little nauseated"  Patient reports having Lt knee surgery in past Patient Stated Goal: To go home   Prior Functioning  Home Living Lives With: Spouse Available Help at Discharge: Family;Available 24 hours/day Type of Home: House Home Access: Stairs to enter [employer] of Steps: 1 Entrance Stairs-Rails: None Home Layout: One level Firefighter: Standard Bathroom Accessibility: Yes How Accessible: Accessible via walker Home Adaptive Equipment:  Walker - rolling;Shower chair with  back;Bedside commode/3-in-1 Prior Function Level of Independence: Independent Able to Take Stairs?: Yes Driving: Yes Vocation: Full time employment Comments: Works at [employer] at [employer]. Communication Communication: No difficulties    Cognition  Cognition Arousal/Alertness: Awake/alert Behavior During Therapy: WFL for tasks assessed/performed Overall Cognitive Status: Within Functional Limits for tasks assessed    Extremity/Trunk Assessment Right Upper Extremity Assessment RUE ROM/Strength/Tone: WFL for tasks assessed RUE Sensation: WFL - Light Touch Left Upper Extremity Assessment LUE ROM/Strength/Tone: WFL for tasks assessed LUE Sensation: WFL - Light Touch Right Lower Extremity Assessment RLE ROM/Strength/Tone: WFL for tasks assessed RLE Sensation: WFL - Light Touch Left Lower Extremity Assessment LLE ROM/Strength/Tone: Deficits;Unable to fully assess;Due to pain LLE ROM/Strength/Tone Deficits: Able to assist moving LLE off of bed. Trunk Assessment Trunk Assessment: Normal   Balance Balance Balance Assessed: Yes Static Sitting Balance Static Sitting - Balance Support: No upper extremity supported;Feet supported Static Sitting - Level of Assistance: 5: Stand by assistance Static Sitting - Comment/# of Minutes: 6 minutes with good balance.  Patient with minimal nausea - decreased over time.  End of Session PT - End of Session Equipment Utilized During Treatment: Gait belt;Oxygen Activity Tolerance: Patient limited by pain;Patient limited by fatigue Patient left: in chair;with call bell/phone within reach;with family/visitor present Nurse Communication: Mobility status (CPM off at 14:55) CPM Left Knee CPM Left Knee: Off (at 14:55)  GP     Vena Austria 08/26/2012, 5:08 PM Durenda Hurt. Renaldo Fiddler, [facility] Acute Rehab Services Pager [phone]

## 2012-08-26 NOTE — Progress Notes (Signed)
PHARMACIST - PHYSICIAN COMMUNICATION  DR:   Turner Daniels  CONCERNING:   Bactrim DS BID was ordered, to continue from home.  I contacted the outpatient pharmacy to verify intended duration of this antibiotic.  It was prescribed for 7 days, and filled on 5/29.   RECOMMENDATION: Please enter a stop date for June 5, if you intend to remain with this duration of treatment.  Thanks, Marisue Humble, PharmD Clinical Pharmacist [facility]

## 2012-08-26 NOTE — Progress Notes (Signed)
Orthopedic Tech Progress Note Patient Details:  Carol [name] 1955-04-05 [id number] Applied CPM to LLE.  Left Footsie Roll with pt.'s nurse. CPM Left Knee CPM Left Knee: On Left Knee Flexion (Degrees): 60 Left Knee Extension (Degrees): 0   Lesle Chris 08/26/2012, 10:22 AM

## 2012-08-26 NOTE — Interval H&P Note (Signed)
History and Physical Interval Note:  08/26/2012 7:14 AM  Carol [name]  has presented today for surgery, with the diagnosis of LOOSE LEFT UNI KNEE  The various methods of treatment have been discussed with the patient and family. After consideration of risks, benefits and other options for treatment, the patient has consented to  Procedure(s) with comments: TOTAL KNEE REVISION (Left) - DEPUY/SIGMA TC-3/MBT STAND-BY, REVISION INSTRUMENTS as a surgical intervention .  The patient's history has been reviewed, patient examined, no change in status, stable for surgery.  I have reviewed the patient's chart and labs.  Questions were answered to the patient's satisfaction.     Nestor Lewandowsky

## 2012-08-26 NOTE — OR Nursing (Signed)
Late entry to add info regarding flash autoclaving.
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
## 2012-08-26 NOTE — Transfer of Care (Signed)
Immediate Anesthesia Transfer of Care Note  Patient: Carol [name]  Procedure(s) Performed: Procedure(s): TOTAL KNEE REVISION (Left)  Patient Location: PACU  Anesthesia Type:General  Level of Consciousness: awake, alert , oriented and patient cooperative  Airway & Oxygen Therapy: Patient Spontanous Breathing and Patient connected to nasal cannula oxygen  Post-op Assessment: Report given to PACU RN, Post -op Vital signs reviewed and stable and Patient moving all extremities  Post vital signs: Reviewed and stable  Complications: No apparent anesthesia complications

## 2012-08-26 NOTE — Anesthesia Postprocedure Evaluation (Signed)
  Anesthesia Post-op Note  Patient: Carol [name]  Procedure(s) Performed: Procedure(s): TOTAL KNEE REVISION (Left)  Patient Location: PACU  Anesthesia Type:General  Level of Consciousness: awake  Airway and Oxygen Therapy: Patient Spontanous Breathing  Post-op Pain: mild  Post-op Assessment: Post-op Vital signs reviewed  Post-op Vital Signs: stable  Complications: No apparent anesthesia complications

## 2012-08-26 NOTE — Progress Notes (Signed)
Received patient from PACU.  Patient arousable, denies pain, vitals stable.  Patient and visitor oriented to room and unit.  Will continue to monitor.

## 2012-08-26 NOTE — OR Nursing (Signed)
Explants sent to [facility] for decontamination. To be returned to Dr. Turner Daniels.

## 2012-08-26 NOTE — Op Note (Signed)
PATIENT ID:      Carol [name]  MRN:     [id number] DOB/AGE:    [birth year] / 57 y.o.       OPERATIVE REPORT    DATE OF PROCEDURE:  08/26/2012       PREOPERATIVE DIAGNOSIS:   LOOSE LEFT UNI KNEE      Estimated body mass index is 34.07 kg/(m^2) as calculated from the following:   Height as of 08/20/12: 5\' 6"  (1.676 m).   Weight as of 01/04/12: 95.709 kg (211 lb).                                                        POSTOPERATIVE DIAGNOSIS:   LOOSE LEFT UNI KNEE                                                                      PROCEDURE:  Procedure(s): TOTAL KNEE REVISION Using Depuy Sigma RP implants #4L Femur, #4Tibia, 10mm sigma RP bearing, 38 Patella     SURGEON: Modine Oppenheimer J    ASSISTANT:   Shirl Harris PA-C   (Present and scrubbed throughout the case, critical for assistance with exposure, retraction, instrumentation, and closure.)         ANESTHESIA: GET , Exaprel  DRAINS: foley, 2 medium hemovac in knee   TOURNIQUET TIME:   COMPLICATIONS:  None     SPECIMENS: None   INDICATIONS FOR PROCEDURE: The patient has  LOOSE LEFT UNI KNEE, on x-ray there is tilting of the tibial component and lateral subluxation of the tibia beneath the femur. The patient has had increasing pain over the last few years.. Patient has failed all conservative measures including anti-inflammatory medicines, narcotics, attempts at  exercise and weight loss. There is present loosening of the tibial component causing her pain. She desires revision to total knee arthroplasty.  Risks and benefits of surgery have been discussed, questions answered.   DESCRIPTION OF PROCEDURE: The patient identified by armband, received  IV antibiotics, in the holding area at [facility]. Patient taken to the operating room, appropriate anesthetic  monitors were attached, and general endotracheal anesthesia induced with  the patient in supine position, Foley catheter was inserted. Tourniquet  applied high  to the operative thigh. Lateral post and foot positioner  applied to the table, the lower extremity was then prepped and draped  in usual sterile fashion from the ankle to the tourniquet. Time-out procedure was performed. The limb was wrapped with an Esmarch bandage and the tourniquet inflated to 350 mmHg. We began the operation by making the anterior midline incision starting at handbreadth above the patella going over the patella 1 cm medial to and  4 cm distal to the tibial tubercle, utilizing the old incision. Small bleeders in the skin and the  subcutaneous tissue identified and cauterized. Transverse retinaculum was incised and reflected medially and a medial parapatellar arthrotomy was accomplished. the patella was everted and theprepatellar fat pad resected. The superficial medial collateral  ligament was then elevated from anterior to posterior along the proximal  flare of  the tibia and anterior half of the menisci resected. The knee was hyperflexed exposing the medial femoral and tibial implants. The tibial implant was easily removed with an osteotome and came right out of its cement bed moderately loose. The femoral component was well fixed and had to be removed using an osteotome going around the periphery and finally loosening along the keel. After the implants have been removed the remaining bone quality was quite good.. Peripheral and notch osteophytes as well as the cruciate ligaments were then resected. We continued to  work our way around posteriorly along the proximal tibia, and externally  rotated the tibia subluxing it out from underneath the femur. A McHale  retractor was placed through the notch and a lateral Hohmann retractor  placed, and we then drilled through the proximal tibia in line with the  axis of the tibia followed by an intramedullary guide rod and 2-degree  posterior slope cutting guide. The tibial cutting guide was pinned into place  allowing resection of 3 mm of  bone medially and about 9 mm of bone  laterally because of her varus deformity. Satisfied with the tibial resection, we then  entered the distal femur 2 mm anterior to the PCL origin with the  intramedullary guide rod and applied the distal femoral cutting guide  set at 11mm, with 5 degrees of valgus. This was pinned along the  epicondylar axis. At this point, the distal femoral cut was accomplished without difficulty. We then sized for a #4 femoral component and pinned the guide in 0 degrees of external rotation, actually estimating the loss of bone from the posterior aspect of the medial femoral condyle.The chamfer cutting guide was pinned into place. The anterior, posterior, and chamfer cuts were accomplished without difficulty followed by  the Sigma RP box cutting guide and the box cut. All of the cuts on the medial side did remove some bone allowing [place] to standard implants. We also removed posterior osteophytes from the posterior femoral condyles. At this  time, the knee was brought into full extension. We checked our  extension and flexion gaps and found them symmetric at 10mm.  The patella thickness measured at 25 mm. We set the cutting guide at 15 and removed the posterior 10 mm  of the patella, sized for a 38 button and drilled the lollipop. The knee  was then once again hyperflexed exposing the proximal tibia. We sized for a #4 tibial base plate, applied the smokestack and the conical reamer followed by the the Delta fin keel punch. We then hammered into place the Sigma RP trial femoral component, inserted a 10-mm trial bearing, trial patellar button, and took the knee through range of motion from 0-130 degrees. No thumb pressure was required for patellar  tracking. At this point, all trial components were removed, a double batch of DePuy HV cement with 1500 mg of Zinacef was mixed and applied to all bony metallic mating surfaces except for the posterior condyles of the femur itself. In order,  we  hammered into place the tibial tray and removed excess cement, the femoral component and removed excess cement, a 10-mm Sigma RP bearing  was inserted, and the knee brought to full extension with compression.  The patellar button was clamped into place, and excess cement  removed. While the cement cured the wound was irrigated out with normal saline solution pulse lavage, and medium Hemovac drains were placed from an anterolateral  approach. Ligament stability and patellar tracking were checked and  found to be excellent. The parapatellar arthrotomy was closed with  running #1 Vicryl suture. The subcutaneous tissue with 0 and 2-0 undyed  Vicryl suture, and the skin with skin staples. A dressing of Xeroform,  4 x 4, dressing sponges, Webril, and Ace wrap applied. The patient  awakened, extubated, and taken to recovery room without difficulty.   Shelbee Apgar J 08/26/2012, 9:12 AM

## 2012-08-27 ENCOUNTER — Encounter (HOSPITAL_COMMUNITY): Payer: Self-pay | Admitting: General Practice

## 2012-08-27 LAB — BASIC METABOLIC PANEL
BUN: 8 mg/dL (ref 6–23)
Calcium: 9 mg/dL (ref 8.4–10.5)
Creatinine, Ser: 0.93 mg/dL (ref 0.50–1.10)
GFR calc Af Amer: 79 mL/min — ABNORMAL LOW (ref >90)
GFR calc non Af Amer: 68 mL/min — ABNORMAL LOW (ref >90)
Glucose, Bld: 146 mg/dL — ABNORMAL HIGH (ref 70–99)
Potassium: 4.3 mEq/L (ref 3.5–5.1)

## 2012-08-27 LAB — CBC
HCT: 31.9 % — ABNORMAL LOW (ref 36.0–46.0)
MCH: 31.6 pg (ref 26.0–34.0)
MCHC: 33.5 g/dL (ref 30.0–36.0)
MCV: 94.1 fL (ref 78.0–100.0)
RDW: 12.3 % (ref 11.5–15.5)

## 2012-08-27 NOTE — Progress Notes (Signed)
Orthopedic Tech Progress Note Patient Details:  Carol [name] 08-18-1955 [id number] Put on cpm at 10:10 pm. Patient ID: [name], female   DOB: [birth year], 57 y.o.   MRN: [id number]   [name] 08/27/2012, 10:11 PM

## 2012-08-27 NOTE — Progress Notes (Signed)
Physical Therapy Note   08/27/12 1500  PT Visit Information  Last PT Received On 08/27/12  Assistance Needed +1  PT Time Calculation  PT Start Time 1400  PT Stop Time 1430  PT Time Calculation (min) 30 min  Subjective Data  Subjective Is hesitant about going home today  Patient Stated Goal Wants to walk without pain at the [facility]  Precautions  Precautions Knee  Precaution Comments Reviewed precautions with patient.  Restrictions  Weight Bearing Restrictions Yes  LLE Weight Bearing WBAT  Cognition  Arousal/Alertness Awake/alert  Behavior During Therapy WFL for tasks assessed/performed  Overall Cognitive Status Within Functional Limits for tasks assessed  Bed Mobility  Bed Mobility Not assessed  Transfers  Transfers Sit to Stand;Stand to Sit  Sit to Stand 4: Min guard;With upper extremity assist;From chair/3-in-1  Stand to Sit 4: Min guard;With upper extremity assist;To chair/3-in-1  Details for Transfer Assistance minguard for safety. cues for hand placement.  Ambulation/Gait  Ambulation/Gait Assistance 4: Min guard  Ambulation Distance (Feet) 80 Feet  Assistive device Rolling walker  Ambulation/Gait Assistance Details cues t activate quad for stance stability; Cues also to self-monitor for actiity tolerance  Gait Pattern Step-to pattern  Gait velocity slowed  Stairs Yes  Stairs Assistance 4: Min assist  Stairs Assistance Details (indicate cue type and reason) Demo and verbal cues for sequence, safety and technique  Stair Management Technique No rails;Backwards;With walker  Number of Stairs 1  PT - End of Session  Equipment Utilized During Treatment Gait belt  Activity Tolerance Patient tolerated treatment well  Patient left (in bathroom, pullstring within reach)  Nurse Communication Mobility status  PT - Assessment/Plan  Comments on Treatment Session Continuing progress; Stair training complete; It seems pain is what is slowing Ms. Corcoran down this afternoon; We  discussed dc'ing, and she is hesitant to go home this evening; Still, from a functional mobility standpoint to go home today isn't unreasonable  PT Plan Discharge plan remains appropriate  PT Frequency 7X/week  Follow Up Recommendations Home health PT;Supervision/Assistance - 24 hour  PT equipment None recommended by PT  Acute Rehab PT Goals  Time For Goal Achievement 09/02/12  Potential to Achieve Goals Good  Pt will go Sit to Stand with supervision;with upper extremity assist  PT Goal: Sit to Stand - Progress Progressing toward goal  Pt will Ambulate 51 - 150 feet;with supervision;with rolling walker  PT Goal: Ambulate - Progress Progressing toward goal  Pt will Go Up / Down Stairs 1-2 stairs;with min assist;with least restrictive assistive device  PT Goal: Up/Down Stairs - Progress Progressing toward goal  PT General Charges  $$ ACUTE PT VISIT 1 Procedure  PT Treatments  $Gait Training 23-37 mins  [place], [place] [phone]

## 2012-08-27 NOTE — Progress Notes (Signed)
Physical Therapy Treatment Patient Details Name: Carol [name] MRN: [id number] DOB: 11/11/1955 Today's Date: 08/27/2012 Time: 4098-1191 PT Time Calculation (min): 37 min  PT Assessment / Plan / Recommendation Comments on Treatment Session  S/p LTKA; progressing well with mobility; Discussed possible dc today, which is well- within the realm of possibility; Will plan to progress amb, stair train next session, and assess pt readiness for dc    Follow Up Recommendations  Home health PT;Supervision/Assistance - 24 hour     Does the patient have the potential to tolerate intense rehabilitation     Barriers to Discharge        Equipment Recommendations  None recommended by PT    Recommendations for Other Services    Frequency 7X/week   Plan Discharge plan remains appropriate    Precautions / Restrictions Precautions Precautions: Knee Precaution Comments: Reviewed precautions with patient. Restrictions LLE Weight Bearing: Weight bearing as tolerated   Pertinent Vitals/Pain 7/10 post amb; Pain ramped up to a 10/10 with knee flexion; Pt was premedicated for pain    Mobility  Bed Mobility Bed Mobility: Supine to Sit;Sitting - Scoot to Edge of Bed Supine to Sit: 4: Min assist;HOB flat Sitting - Scoot to Edge of Bed: 4: Min guard;With rail Details for Bed Mobility Assistance: Verbal cues for technique.  Assist to move LLE off of bed. Transfers Transfers: Sit to Stand;Stand to Sit Sit to Stand: 4: Min guard;With upper extremity assist Stand to Sit: 4: Min guard;With upper extremity assist Details for Transfer Assistance: Cues for safety, positioning for transfers Ambulation/Gait Ambulation/Gait Assistance: 4: Min assist Ambulation Distance (Feet): 40 Feet Assistive device: Rolling walker Ambulation/Gait Assistance Details: Cues to activate quad for L stance stability Gait Pattern: Step-to pattern    Exercises Total Joint Exercises Quad Sets: AROM;Both;10 reps Short Arc  QuadBarbaraann [name];Left;10 reps Heel Slides: AAROM;Left;10 reps Straight Leg Raises: AAROM;Left;10 reps Goniometric ROM: 2-80deg   PT Diagnosis:    PT Problem List:   PT Treatment Interventions:     PT Goals Acute Rehab PT Goals Time For Goal Achievement: 09/02/12 Potential to Achieve Goals: Good Pt will go Supine/Side to Sit: with supervision;with HOB 0 degrees PT Goal: Supine/Side to Sit - Progress: Progressing toward goal Pt will go Sit to Stand: with supervision;with upper extremity assist PT Goal: Sit to Stand - Progress: Progressing toward goal Pt will Ambulate: 51 - 150 feet;with supervision;with rolling walker PT Goal: Ambulate - Progress: Progressing toward goal Pt will Perform Home Exercise Program: with supervision, verbal cues required/provided PT Goal: Perform Home Exercise Program - Progress: Progressing toward goal  Visit Information  Last PT Received On: 08/27/12 Assistance Needed: +1    Subjective Data  Subjective: Agreeable to therex and amb Patient Stated Goal: Wants to walk without pain at the [employer]  Cognition Arousal/Alertness: Awake/alert Behavior During Therapy: WFL for tasks assessed/performed Overall Cognitive Status: Within Functional Limits for tasks assessed    Balance     End of Session PT - End of Session Equipment Utilized During Treatment: Gait belt Activity Tolerance: Patient tolerated treatment well Patient left: in chair;with call bell/phone within reach (setup for lunch) Nurse Communication: Mobility status   GP     Olen Pel [place], [place] [phone]  08/27/2012, 12:37 PM

## 2012-08-27 NOTE — Evaluation (Signed)
Occupational Therapy Evaluation Patient Details Name: Carol [name] MRN: [id number] DOB: [birth year] Today's Date: 08/27/2012 Time: 6578-4696 OT Time Calculation (min): 45 min  OT Assessment / Plan / Recommendation Clinical Impression    Patient is a 57 yo female s/p Lt. TKA. Pt did well in session. Answered any questions pt had. Feel pt is safe to d/c home with 24/7 assist from family.      OT Assessment  Patient does not need any further OT services    Follow Up Recommendations  No OT follow up;Supervision/Assistance - 24 hour    Barriers to Discharge      Equipment Recommendations  None recommended by OT    Recommendations for Other Services    Frequency       Precautions / Restrictions Precautions Precautions: Knee Precaution Comments: Reviewed precautions with patient. Restrictions Weight Bearing Restrictions: Yes LLE Weight Bearing: Weight bearing as tolerated   Pertinent Vitals/Pain Pain 8/10. Nurse notified for pain meds. Repositioned.     ADL  Eating/Feeding: Independent Where Assessed - Eating/Feeding: Chair Grooming: Set up Where Assessed - Grooming: Unsupported sitting Upper Body Bathing: Set up Where Assessed - Upper Body Bathing: Unsupported sitting Lower Body Bathing: Min guard Where Assessed - Lower Body Bathing: Supported sit to stand Upper Body Dressing: Set up Where Assessed - Upper Body Dressing: Unsupported sitting Lower Body Dressing: Min guard Where Assessed - Lower Body Dressing: Supported sit to stand Toilet Transfer: Performed;Min guard Statistician Method: Sit to Barista: Raised toilet seat with arms (or 3-in-1 over toilet) Toileting - Clothing Manipulation and Hygiene: Performed;Min guard Where Assessed - Engineer, mining and Hygiene: Sit to stand from 3-in-1 or toilet Tub/Shower Transfer Method: Not assessed Equipment Used: Gait belt;Rolling walker Transfers/Ambulation Related to ADLs:  Minguard ADL Comments: Pt able to don/doff Lt sock while sitting in chair. Pt verbalized that she wanted to practice tub transfer and pt practiced backing to 3 in 1 in tub at [facility] assist and cues for hand placement. Pt overall Minguard A for LB ADLs. Educated on dressing technique.    OT Diagnosis:    OT Problem List:   OT Treatment Interventions:     OT Goals    Visit Information  Last OT Received On: 08/27/12 Assistance Needed: +1 PT/OT Co-Evaluation/Treatment: Yes (part of session)    Subjective Data      Prior Functioning     Home Living Lives With: Spouse Available Help at Discharge: Family;Available 24 hours/day Type of Home: House Home Access: Stairs to enter [employer] of Steps: 1 Entrance Stairs-Rails: None Home Layout: One level Bathroom Shower/Tub: Engineer, manufacturing systems: Standard Bathroom Accessibility: Yes How Accessible: Accessible via walker Home Adaptive Equipment: Walker - rolling;Shower chair with back;Bedside commode/3-in-1 Prior Function Level of Independence: Independent Able to Take Stairs?: Yes Driving: Yes Vocation: Full time employment Comments: works at [employer] at [employer]: No difficulties         Vision/Perception     Copywriter, advertising Arousal/Alertness: Awake/alert Behavior During Therapy: WFL for tasks assessed/performed Overall Cognitive Status: Within Functional Limits for tasks assessed    Extremity/Trunk Assessment Right Upper Extremity Assessment RUE ROM/Strength/Tone: [facility] for tasks assessed Left Upper Extremity Assessment LUE ROM/Strength/Tone: WFL for tasks assessed     Mobility Bed Mobility Bed Mobility: Not assessed Supine to Sit: 4: Min assist;HOB flat Sitting - Scoot to Edge of Bed: 4: Min guard;With rail Details for Bed Mobility Assistance: Verbal cues for technique.  Assist to  move LLE off of bed. Transfers Transfers: Sit to Stand;Stand to Sit Sit to  Stand: 4: Min guard;With upper extremity assist;From chair/3-in-1 Stand to Sit: 4: Min guard;With upper extremity assist;To chair/3-in-1 Details for Transfer Assistance: minguard for safety. cues for hand placement.        Balance     End of Session OT - End of Session Equipment Utilized During Treatment: Gait belt Activity Tolerance: Patient tolerated treatment well Patient left: Other (comment);with family/visitor present (on 3 in 1 ) Nurse Communication: Patient requests pain meds  GO     Earlie Raveling OTR/L [phone] 08/27/2012, 3:34 PM

## 2012-08-27 NOTE — Care Management Note (Signed)
CARE MANAGEMENT NOTE 08/27/2012  Patient:  Carol [name]   Account Number:  [id number]  Date Initiated:  08/27/2012  Documentation initiated by:  Kadance Mccuistion  Subjective/Objective Assessment:   57 yr old female s/p hardware removal in right knee     Action/Plan:   CM spoke with patient concerning HH needs at discharge.Choice offered. Patient states he has rolling walker, has 3in1 and a wheelchair.   Anticipated DC Date:  08/28/2012   Anticipated DC Plan:  HOME W HOME HEALTH SERVICES      DC Planning Services  CM consult      PAC Choice  HOME HEALTH   Choice offered to / List presented to:  C-1 Patient        HH arranged  HH-2 PT      HH agency  [facility]   Status of service:  Completed, signed off Medicare Important Message given?   (If response is "NO", the following Medicare IM given date fields will be blank) Date Medicare IM given:   Date Additional Medicare IM given:    Discharge Disposition:  HOME W HOME HEALTH SERVICES  Per UR Regulation:    If discussed at Long Length of Stay Meetings, dates discussed:    Comments:    

## 2012-08-27 NOTE — Progress Notes (Signed)
Patient ID: Carol [name], female   DOB: 05-19-1955, 57 y.o.   MRN: [id number] PATIENT ID: Carol [name]  MRN: [id number]  DOB/AGE:  07/25/1955 / 57 y.o.  1 Day Post-Op Procedure(s) (LRB): TOTAL KNEE REVISION (Left)    PROGRESS NOTE Subjective: Patient is alert, oriented, no Nausea, no Vomiting, yes passing gas, no Bowel Movement. Taking PO well. Denies SOB, Chest or Calf Pain. Using Incentive Spirometer, PAS in place. Ambulate took a few steps and room yesterday weightbearing as tolerated, CPM 0-60 Patient reports pain as 3 on 0-10 scale  .    Objective: Vital signs in last 24 hours: Filed Vitals:   08/26/12 2000 08/26/12 2304 08/27/12 0000 08/27/12 0546  BP:  122/88  103/63  Pulse:  107  92  Temp:  98.3 F (36.8 C)  98.1 F (36.7 C)  TempSrc:      Resp: 17 16 19 16   SpO2: 98% 100% 99% 100%      Intake/Output from previous day: I/O last 3 completed shifts: In: 2200 [I.V.:2200] Out: 700 [Urine:400; Drains:300]   Intake/Output this shift: Total I/O In: -  Out: 1600 [Urine:1500; Drains:100]   LABORATORY DATA:  Recent Labs  08/27/12 0510  WBC 10.5  HGB 10.7*  HCT 31.9*  PLT 238  NA 137  K 4.3  CL 103  CO2 28  BUN 8  CREATININE 0.93  GLUCOSE 146*  CALCIUM 9.0    Examination: Neurologically intact ABD soft Neurovascular intact Sensation intact distally Intact pulses distally Dorsiflexion/Plantar flexion intact Incision: scant drainage No cellulitis present Compartment soft} Blood and plasma separated in drain indicating minimal recent drainage, drain pulled without difficulty.  Assessment:   1 Day Post-Op Procedure(s) (LRB): TOTAL KNEE REVISION (Left) ADDITIONAL DIAGNOSIS:  Hypertension  Plan: PT/OT WBAT, CPM 5/hrs day until ROM 0-90 degrees, then D/C CPM DVT Prophylaxis:  SCDx72hrs, ASA 325 mg BID x 2 weeks DISCHARGE PLAN: Home, probably in one to 2 days per DISCHARGE NEEDS: HHPT, HHRN, CPM, Walker and 3-in-1 comode seat     Carol [name]  J 08/27/2012, 6:52 AM

## 2012-08-27 NOTE — Progress Notes (Signed)
Utilization review complete. Evans Levee RN CCM Case Mgmt phone [phone] 

## 2012-08-28 LAB — CBC
MCH: 31.5 pg (ref 26.0–34.0)
MCHC: 33.2 g/dL (ref 30.0–36.0)
Platelets: 206 10*3/uL (ref 150–400)
RDW: 12.7 % (ref 11.5–15.5)

## 2012-08-28 NOTE — Progress Notes (Signed)
PATIENT ID: Carol [name]  MRN: [id number]  DOB/AGE:  1955/08/20 / 57 y.o.  2 Days Post-Op Procedure(s) (LRB): TOTAL KNEE REVISION (Left)    PROGRESS NOTE Subjective: Patient is alert, oriented, no Nausea, no Vomiting, yes passing gas, no Bowel Movement. Taking PO well. Denies SOB, Chest or Calf Pain. Using Incentive Spirometer, PAS in place. Ambulating well with PT, CPM 0-60 Patient reports pain as moderate  .    Objective: Vital signs in last 24 hours: Filed Vitals:   08/27/12 0546 08/27/12 1357 08/27/12 2101 08/28/12 0629  BP: 103/63 122/62 116/55 128/56  Pulse: 92 91 91 100  Temp: 98.1 F (36.7 C) 98.2 F (36.8 C) 99.2 F (37.3 C) 98 F (36.7 C)  TempSrc:      Resp: 16 18 18 18   SpO2: 100% 100% 100% 100%      Intake/Output from previous day: I/O last 3 completed shifts: In: 960 [P.O.:960] Out: 1600 [Urine:1500; Drains:100]   Intake/Output this shift:     LABORATORY DATA:  Recent Labs  08/27/12 0510 08/28/12 0518  WBC 10.5 8.1  HGB 10.7* 9.5*  HCT 31.9* 28.6*  PLT 238 206  NA 137  --   K 4.3  --   CL 103  --   CO2 28  --   BUN 8  --   CREATININE 0.93  --   GLUCOSE 146*  --   CALCIUM 9.0  --     Examination: Neurologically intact ABD soft Neurovascular intact Sensation intact distally Intact pulses distally Dorsiflexion/Plantar flexion intact Incision: dressing C/D/I}  Assessment:   2 Days Post-Op Procedure(s) (LRB): TOTAL KNEE REVISION (Left) ADDITIONAL DIAGNOSIS:  none  Plan: PT/OT WBAT, CPM 5/hrs day until ROM 0-90 degrees, then D/C CPM DVT Prophylaxis:  SCDx72hrs, ASA 325 mg BID x 2 weeks DISCHARGE PLAN: Home today DISCHARGE NEEDS: HHPT, HHRN, CPM, Walker and 3-in-1 comode seat     Balazs,Carol Spellman M. 08/28/2012, 7:44 AM

## 2012-08-28 NOTE — Progress Notes (Signed)
Reviewed discharge instructions with patient. All questions answered. Pain medication given. IV removed. Dressing changed.

## 2012-08-28 NOTE — Discharge Summary (Signed)
Patient ID: Carol [name] MRN: [id number] DOB/AGE: [birth year] 57 y.o.  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 Hypertension   . MVP (mitral valve prolapse)   . PONV (postoperative nausea and vomiting)   . GERD (gastroesophageal reflux disease)   . Arthritis   . Endometriosis   . Anemia     Surgeries: Procedure(s): TOTAL KNEE REVISION on 08/26/2012   Consultants:    Discharged Condition: Improved  Hospital Course: Carol [name] is an 57 y.o. female who was admitted 08/26/2012 for operative treatment ofPain due to unicompartmental arthroplasty of knee. Patient has severe unremitting pain that affects sleep, daily activities, and work/hobbies. After pre-op clearance the patient was taken to the operating room on 08/26/2012 and underwent  Procedure(s): TOTAL KNEE REVISION.    Patient was given perioperative antibiotics: Anti-infectives   Start     Dose/Rate Route Frequency Ordered Stop   08/26/12 1300  sulfamethoxazole-trimethoprim (BACTRIM DS) 800-160 MG per tablet 1 tablet    Comments:  For UTI   1 tablet Oral 2 times daily 08/26/12 1155     08/26/12 0816  cefUROXime (ZINACEF) injection  Status:  Discontinued       As needed 08/26/12 0817 08/26/12 0936   08/26/12 0600  ceFAZolin (ANCEF) IVPB 2 g/50 mL premix     2 g 100 mL/hr over 30 Minutes Intravenous On call to O.R. 08/25/12 1315 08/26/12 0740       Patient was given sequential compression devices, early ambulation, and chemoprophylaxis to prevent DVT.  Patient benefited maximally from hospital stay and there were no complications.    Recent vital signs: Patient Vitals for the past 24 hrs:  BP Temp Pulse Resp SpO2  08/28/12 0629 128/56 mmHg 98 F (36.7 C) 100 18 100 %  08/27/12 2101 116/55 mmHg 99.2 F (37.3 C) 91 18 100 %   08/27/12 1357 122/62 mmHg 98.2 F (36.8 C) 91 18 100 %     Recent laboratory studies:  Recent Labs  08/27/12 0510 08/28/12 0518  WBC 10.5 8.1  HGB 10.7* 9.5*  HCT 31.9* 28.6*  PLT 238 206  NA 137  --   K 4.3  --   CL 103  --   CO2 28  --   BUN 8  --   CREATININE 0.93  --   GLUCOSE 146*  --   CALCIUM 9.0  --      Discharge Medications:     Medication List    STOP taking these medications       HYDROcodone-acetaminophen 5-325 MG per tablet  Commonly known as:  NORCO/VICODIN     ibuprofen 200 MG tablet  Commonly known as:  ADVIL,MOTRIN     meloxicam 15 MG tablet  Commonly known as:  MOBIC      TAKE these medications       aspirin 325 MG EC tablet  Take 1 tablet (325 mg total) by mouth 2 (two) times daily.     famotidine 20 MG tablet  Commonly known as:  PEPCID  Take 20 mg by mouth 2 (two) times daily as needed for heartburn.     methocarbamol 500 MG tablet  Commonly known as:  ROBAXIN  Take 1 tablet (500 mg total) by mouth every 6 (six) hours as needed.     oxyCODONE-acetaminophen 5-325  MG per tablet  Commonly known as:  ROXICET  Take 1-2 tablets by mouth every 4 (four) hours as needed for pain.     quinapril-hydrochlorothiazide 10-12.5 MG per tablet  Commonly known as:  ACCURETIC  Take 1 tablet by mouth daily.     sulfamethoxazole-trimethoprim 800-160 MG per tablet  Commonly known as:  BACTRIM DS  Take 1 tablet by mouth 2 (two) times daily.        Diagnostic Studies: Dg Chest 2 View  08/20/2012   *RADIOLOGY REPORT*  Clinical Data: Preoperative evaluation.  History of mitral valve prolapse.  Nonsmoker.  Hypertension  CHEST - 2 VIEW  Comparison: 07/24/2007  Findings: Slightly lower lung volumes are present and taking this into consideration heart and mediastinal contours are normal and stable.  The lung fields appear clear with no signs of focal infiltrate or congestive failure.  No pleural fluid or significant peribronchial cuffing is identified.   Bony structures appear intact.  IMPRESSION: Slightly lower lung volumes with an otherwise stable cardiopulmonary appearance and no new focal or acute abnormality.   Original Report Authenticated By: Rhodia Albright, M.D.   Mm Digital Screening  08/15/2012   *RADIOLOGY REPORT*  Clinical Data: Screening.  DIGITAL BILATERAL SCREENING MAMMOGRAM WITH CAD  Comparison: Previous exams.  FINDINGS:  ACR Breast Density Category 1: The breast tissue is almost entirely fatty.  No suspicious masses, non-surgical architectural distortion, or suspicious calcifications are identified.  Images were processed with CAD.  IMPRESSION: No mammographic evidence of malignancy.  A result letter of this screening mammogram will be mailed directly to the patient.  RECOMMENDATION: Screening mammogram in one year. (Code:SM-B-01Y)  BI-RADS CATEGORY 1:  Negative.   Original Report Authenticated By: Elberta Fortis, M.D.    Disposition: Final discharge disposition not confirmed      Discharge Orders   Future Orders Complete By Expires     Call MD for:  redness, tenderness, or signs of infection (pain, swelling, redness, odor or green/yellow discharge around incision site)  As directed     Call MD for:  severe uncontrolled pain  As directed     Call MD for:  temperature >100.4  As directed     Change dressing (specify)  As directed     Comments:      Dressing change as needed.    Discharge instructions  As directed     Comments:      F/U with Dr. Turner Daniels as scheduled (14 days post-op)    Driving Restrictions  As directed     Comments:      No driving for 2 weeks.    Increase activity slowly  As directed     May shower / Bathe  As directed     Walker   As directed           Signed: Kruse,Darla Mcdonald M. 08/28/2012, 9:14 AM

## 2012-08-28 NOTE — Progress Notes (Signed)
Physical Therapy Treatment Patient Details Name: Carol [name] MRN: [id number] DOB: 21-May-1955 Today's Date: 08/28/2012 Time: 4098-1191 PT Time Calculation (min): 27 min  PT Assessment / Plan / Recommendation Comments on Treatment Session  Continuing progress, and pt expresses confidence in ability to manage at home; OK for dc from PT standpoint    Follow Up Recommendations  Home health PT;Supervision/Assistance - 24 hour     Does the patient have the potential to tolerate intense rehabilitation     Barriers to Discharge        Equipment Recommendations  None recommended by PT    Recommendations for Other Services    Frequency 7X/week   Plan Discharge plan remains appropriate    Precautions / Restrictions Precautions Precautions: Knee Precaution Comments: Reviewed precautions with patient. Restrictions LLE Weight Bearing: Weight bearing as tolerated   Pertinent Vitals/Pain 5/10 L knee; Repositioned in chair    Mobility  Bed Mobility Bed Mobility: Supine to Sit;Sitting - Scoot to Edge of Bed Supine to Sit: 5: Supervision Sitting - Scoot to Edge of Bed: 5: Supervision Details for Bed Mobility Assistance: Smooth transition; Uses UEs to help LLE off of bed; encouraged pt avoid this and use own LE musculature Transfers Transfers: Sit to Stand;Stand to Sit Sit to Stand: 5: Supervision Stand to Sit: 5: Supervision Details for Transfer Assistance: Cues for hand placement Ambulation/Gait Ambulation/Gait Assistance: 5: Supervision Ambulation Distance (Feet): 40 Feet Assistive device: Rolling walker Ambulation/Gait Assistance Details: Cues for  L stance stability   Exercises     PT Diagnosis:    PT Problem List:   PT Treatment Interventions:     PT Goals Acute Rehab PT Goals Time For Goal Achievement: 09/02/12 Potential to Achieve Goals: Good Pt will go Supine/Side to Sit: with supervision;with HOB 0 degrees PT Goal: Supine/Side to Sit - Progress: Partly met Pt  will go Sit to Stand: with supervision;with upper extremity assist PT Goal: Sit to Stand - Progress: Met Pt will Ambulate: 51 - 150 feet;with supervision;with rolling walker PT Goal: Ambulate - Progress: Progressing toward goal  Visit Information  Last PT Received On: 08/28/12 Assistance Needed: +1    Subjective Data  Subjective: Looking forward to dc Patient Stated Goal: Wants to walk without pain at the [employer]  Cognition Arousal/Alertness: Awake/alert Behavior During Therapy: WFL for tasks assessed/performed Overall Cognitive Status: Within Functional Limits for tasks assessed    Balance     End of Session PT - End of Session Equipment Utilized During Treatment: Gait belt Activity Tolerance: Patient tolerated treatment well Patient left:  (in bathroom, pullstring within reach) Nurse Communication: Mobility status   GP     Olen Pel [place], [place] [phone]  08/28/2012, 1:58 PM

## 2012-09-16 ENCOUNTER — Ambulatory Visit: Payer: BC Managed Care – PPO | Attending: Orthopedic Surgery | Admitting: Rehabilitation

## 2012-09-16 DIAGNOSIS — R609 Edema, unspecified: Secondary | ICD-10-CM | POA: Insufficient documentation

## 2012-09-16 DIAGNOSIS — IMO0001 Reserved for inherently not codable concepts without codable children: Secondary | ICD-10-CM | POA: Insufficient documentation

## 2012-09-16 DIAGNOSIS — M25569 Pain in unspecified knee: Secondary | ICD-10-CM | POA: Insufficient documentation

## 2012-09-16 DIAGNOSIS — M25669 Stiffness of unspecified knee, not elsewhere classified: Secondary | ICD-10-CM | POA: Insufficient documentation

## 2012-09-18 ENCOUNTER — Ambulatory Visit: Payer: BC Managed Care – PPO | Admitting: Rehabilitation

## 2012-09-20 ENCOUNTER — Ambulatory Visit: Payer: BC Managed Care – PPO | Admitting: Rehabilitation

## 2012-09-23 ENCOUNTER — Ambulatory Visit: Payer: BC Managed Care – PPO | Admitting: Rehabilitation

## 2012-09-25 ENCOUNTER — Ambulatory Visit: Payer: BC Managed Care – PPO | Attending: Orthopedic Surgery | Admitting: Rehabilitation

## 2012-09-25 DIAGNOSIS — R609 Edema, unspecified: Secondary | ICD-10-CM | POA: Insufficient documentation

## 2012-09-25 DIAGNOSIS — IMO0001 Reserved for inherently not codable concepts without codable children: Secondary | ICD-10-CM | POA: Insufficient documentation

## 2012-09-25 DIAGNOSIS — M25569 Pain in unspecified knee: Secondary | ICD-10-CM | POA: Insufficient documentation

## 2012-09-25 DIAGNOSIS — M25669 Stiffness of unspecified knee, not elsewhere classified: Secondary | ICD-10-CM | POA: Insufficient documentation

## 2012-09-26 ENCOUNTER — Ambulatory Visit: Payer: BC Managed Care – PPO | Admitting: Rehabilitation

## 2012-09-30 ENCOUNTER — Ambulatory Visit: Payer: BC Managed Care – PPO | Admitting: Rehabilitation

## 2012-10-02 ENCOUNTER — Ambulatory Visit: Payer: BC Managed Care – PPO | Admitting: Rehabilitation

## 2012-10-03 ENCOUNTER — Ambulatory Visit: Payer: BC Managed Care – PPO | Admitting: Rehabilitation

## 2012-10-07 ENCOUNTER — Ambulatory Visit: Payer: BC Managed Care – PPO | Admitting: Rehabilitation

## 2012-10-09 ENCOUNTER — Ambulatory Visit: Payer: BC Managed Care – PPO | Admitting: Rehabilitation

## 2012-10-14 ENCOUNTER — Ambulatory Visit: Payer: BC Managed Care – PPO | Admitting: Rehabilitation

## 2012-10-17 ENCOUNTER — Ambulatory Visit: Payer: BC Managed Care – PPO | Admitting: Rehabilitation

## 2012-10-21 ENCOUNTER — Ambulatory Visit: Payer: BC Managed Care – PPO | Admitting: Rehabilitation

## 2012-10-23 ENCOUNTER — Ambulatory Visit: Payer: BC Managed Care – PPO | Admitting: Rehabilitation

## 2012-10-24 ENCOUNTER — Ambulatory Visit: Payer: BC Managed Care – PPO | Admitting: Rehabilitation

## 2012-10-28 ENCOUNTER — Ambulatory Visit: Payer: BC Managed Care – PPO | Attending: Orthopedic Surgery | Admitting: Rehabilitation

## 2012-10-28 DIAGNOSIS — M25569 Pain in unspecified knee: Secondary | ICD-10-CM | POA: Insufficient documentation

## 2012-10-28 DIAGNOSIS — R609 Edema, unspecified: Secondary | ICD-10-CM | POA: Insufficient documentation

## 2012-10-28 DIAGNOSIS — IMO0001 Reserved for inherently not codable concepts without codable children: Secondary | ICD-10-CM | POA: Insufficient documentation

## 2012-10-28 DIAGNOSIS — M25669 Stiffness of unspecified knee, not elsewhere classified: Secondary | ICD-10-CM | POA: Insufficient documentation

## 2012-10-30 ENCOUNTER — Ambulatory Visit: Payer: BC Managed Care – PPO | Admitting: Rehabilitation

## 2012-11-04 ENCOUNTER — Ambulatory Visit: Payer: BC Managed Care – PPO | Admitting: Rehabilitation

## 2012-11-06 ENCOUNTER — Ambulatory Visit: Payer: BC Managed Care – PPO | Admitting: Rehabilitation

## 2013-10-20 ENCOUNTER — Other Ambulatory Visit (HOSPITAL_BASED_OUTPATIENT_CLINIC_OR_DEPARTMENT_OTHER): Payer: Self-pay | Admitting: Obstetrics and Gynecology

## 2013-10-20 DIAGNOSIS — Z1231 Encounter for screening mammogram for malignant neoplasm of breast: Secondary | ICD-10-CM

## 2013-10-22 ENCOUNTER — Ambulatory Visit (HOSPITAL_BASED_OUTPATIENT_CLINIC_OR_DEPARTMENT_OTHER)
Admission: RE | Admit: 2013-10-22 | Discharge: 2013-10-22 | Disposition: A | Discharge status: Home / Self Care | Payer: BC Managed Care – PPO | Source: Ambulatory Visit | Attending: Obstetrics and Gynecology | Admitting: Obstetrics and Gynecology

## 2013-10-22 DIAGNOSIS — Z1231 Encounter for screening mammogram for malignant neoplasm of breast: Secondary | ICD-10-CM | POA: Insufficient documentation

## 2014-08-20 ENCOUNTER — Encounter: Payer: Self-pay | Admitting: *Deleted

## 2015-04-21 ENCOUNTER — Other Ambulatory Visit (HOSPITAL_BASED_OUTPATIENT_CLINIC_OR_DEPARTMENT_OTHER): Payer: Self-pay | Admitting: Obstetrics and Gynecology

## 2015-04-21 DIAGNOSIS — Z1231 Encounter for screening mammogram for malignant neoplasm of breast: Secondary | ICD-10-CM

## 2015-05-03 ENCOUNTER — Ambulatory Visit (HOSPITAL_BASED_OUTPATIENT_CLINIC_OR_DEPARTMENT_OTHER)
Admission: RE | Admit: 2015-05-03 | Discharge: 2015-05-03 | Disposition: A | Discharge status: Home / Self Care | Payer: BLUE CROSS/BLUE SHIELD | Source: Ambulatory Visit | Attending: Obstetrics and Gynecology | Admitting: Obstetrics and Gynecology

## 2015-05-03 DIAGNOSIS — Z1231 Encounter for screening mammogram for malignant neoplasm of breast: Secondary | ICD-10-CM

## 2016-09-25 ENCOUNTER — Other Ambulatory Visit (HOSPITAL_BASED_OUTPATIENT_CLINIC_OR_DEPARTMENT_OTHER): Payer: Self-pay | Admitting: Obstetrics and Gynecology

## 2016-09-25 DIAGNOSIS — Z1231 Encounter for screening mammogram for malignant neoplasm of breast: Secondary | ICD-10-CM

## 2016-09-28 ENCOUNTER — Ambulatory Visit (HOSPITAL_BASED_OUTPATIENT_CLINIC_OR_DEPARTMENT_OTHER)
Admission: RE | Admit: 2016-09-28 | Discharge: 2016-09-28 | Disposition: A | Discharge status: Home / Self Care | Payer: BLUE CROSS/BLUE SHIELD | Source: Ambulatory Visit | Attending: Obstetrics and Gynecology | Admitting: Obstetrics and Gynecology

## 2016-09-28 DIAGNOSIS — Z1231 Encounter for screening mammogram for malignant neoplasm of breast: Secondary | ICD-10-CM | POA: Diagnosis not present

## 2017-11-15 ENCOUNTER — Other Ambulatory Visit (HOSPITAL_BASED_OUTPATIENT_CLINIC_OR_DEPARTMENT_OTHER): Payer: Self-pay | Admitting: Obstetrics and Gynecology

## 2017-11-15 DIAGNOSIS — Z1231 Encounter for screening mammogram for malignant neoplasm of breast: Secondary | ICD-10-CM

## 2017-11-21 ENCOUNTER — Encounter (HOSPITAL_BASED_OUTPATIENT_CLINIC_OR_DEPARTMENT_OTHER): Payer: Self-pay

## 2017-11-21 ENCOUNTER — Ambulatory Visit (HOSPITAL_BASED_OUTPATIENT_CLINIC_OR_DEPARTMENT_OTHER)
Admission: RE | Admit: 2017-11-21 | Discharge: 2017-11-21 | Disposition: A | Discharge status: Home / Self Care | Payer: PRIVATE HEALTH INSURANCE | Source: Ambulatory Visit | Attending: Obstetrics and Gynecology | Admitting: Obstetrics and Gynecology

## 2017-11-21 DIAGNOSIS — Z1231 Encounter for screening mammogram for malignant neoplasm of breast: Secondary | ICD-10-CM | POA: Diagnosis present

## 2018-11-15 ENCOUNTER — Other Ambulatory Visit (HOSPITAL_BASED_OUTPATIENT_CLINIC_OR_DEPARTMENT_OTHER): Payer: Self-pay | Admitting: Obstetrics and Gynecology

## 2018-11-15 DIAGNOSIS — Z1231 Encounter for screening mammogram for malignant neoplasm of breast: Secondary | ICD-10-CM

## 2018-11-25 ENCOUNTER — Ambulatory Visit (HOSPITAL_BASED_OUTPATIENT_CLINIC_OR_DEPARTMENT_OTHER)
Admission: RE | Admit: 2018-11-25 | Discharge: 2018-11-25 | Disposition: A | Discharge status: Home / Self Care | Payer: PRIVATE HEALTH INSURANCE | Source: Ambulatory Visit | Attending: Obstetrics and Gynecology | Admitting: Obstetrics and Gynecology

## 2018-11-25 ENCOUNTER — Encounter (HOSPITAL_BASED_OUTPATIENT_CLINIC_OR_DEPARTMENT_OTHER): Payer: Self-pay

## 2018-11-25 ENCOUNTER — Other Ambulatory Visit: Payer: Self-pay

## 2018-11-25 DIAGNOSIS — Z1231 Encounter for screening mammogram for malignant neoplasm of breast: Secondary | ICD-10-CM

## 2019-08-06 ENCOUNTER — Other Ambulatory Visit: Payer: Self-pay | Admitting: Orthopedic Surgery

## 2019-08-18 NOTE — Progress Notes (Addendum)
PCP - Milus Height, PA Cardiologist -   Chest x-ray -  EKG -  Stress Test -  ECHO -  Cardiac Cath -   Sleep Study -  CPAP -   Fasting Blood Sugar -  Checks Blood Sugar _____ times a day  Blood Thinner Instructions: Aspirin Instructions: 81 mg ASA, Pt is have not been told to hold  Last Dose:  Anesthesia review:   Patient denies shortness of breath, fever, cough and chest pain at PAT appointment   Patient verbalized understanding of instructions that were given to them at the PAT appointment. Patient was also instructed that they will need to review over the PAT instructions again at home before surgery.

## 2019-08-18 NOTE — Patient Instructions (Addendum)
DUE TO COVID-19 ONLY ONE VISITOR IS ALLOWED TO COME WITH YOU AND STAY IN THE WAITING ROOM ONLY DURING PRE OP AND PROCEDURE DAY OF SURGERY. THE 1 VISITOR MAY VISIT WITH YOU AFTER SURGERY IN YOUR PRIVATE ROOM DURING VISITING HOURS ONLY!  YOU NEED TO HAVE A COVID 19 TEST ON 08-28-19 @ 2:50 PM, THIS TEST MUST BE DONE BEFORE SURGERY, COME  [address], [place] , [address].  [facility]) ONCE YOUR COVID TEST IS COMPLETED, PLEASE BEGIN THE QUARANTINE INSTRUCTIONS AS OUTLINED IN YOUR HANDOUT.                Carol [name]  08/18/2019   Your procedure is scheduled on: 09-01-19   Report to [facility] Main  Entrance    Report to Admitting at 5:30 AM     Call this number if you have problems the morning of surgery [phone]    Remember: AFTER MIDNIGHT THE NIGHT PRIOR TO SURGERY. NOTHING BY MOUTH EXCEPT CLEAR LIQUIDS UNTIL 4:30 AM. PLEASE FINISH ENSURE DRINK PER SURGEON ORDER  WHICH NEEDS TO BE COMPLETED AT 4:30 AM.   CLEAR LIQUID DIET   Foods Allowed                                                                     Foods Excluded  Coffee and tea, regular and decaf                             liquids that you cannot  Plain Jell-O any favor except red or purple                                           see through such as: Fruit ices (not with fruit pulp)                                     milk, soups, orange juice  Iced Popsicles                                    All solid food Carbonated beverages, regular and diet                                    Cranberry, grape and apple juices Sports drinks like Gatorade Lightly seasoned clear broth or consume(fat free) Sugar, honey syrup   _____________________________________________________________________      Take these medicines the morning of surgery with A SIP OF WATER: Atorvastatin (Lipitor), Pepcid, Omeprazole, and Eyedrops as needed  BRUSH YOUR TEETH MORNING OF SURGERY AND RINSE YOUR MOUTH OUT,  NO CHEWING GUM CANDY OR MINTS.                               You may not have any metal on your body including  hair pins and              piercings     Do not wear jewelry, make-up, lotions, powders or perfumes, deodorant              Do not wear nail polish on your fingernails.  Do not shave  48 hours prior to surgery.             Do not bring valuables to the hospital. [place].  Contacts, dentures or bridgework may not be worn into surgery.  You may bring a small overnight bag    Special Instructions: N/A              Please read over the following fact sheets you were given: _____________________________________________________________________             [facility] - Preparing for Surgery Before surgery, you can play an important role.  Because skin is not sterile, your skin needs to be as free of germs as possible.  You can reduce the number of germs on your skin by washing with CHG (chlorahexidine gluconate) soap before surgery.  CHG is an antiseptic cleaner which kills germs and bonds with the skin to continue killing germs even after washing. Please DO NOT use if you have an allergy to CHG or antibacterial soaps.  If your skin becomes reddened/irritated stop using the CHG and inform your nurse when you arrive at Short Stay. Do not shave (including legs and underarms) for at least 48 hours prior to the first CHG shower.  You may shave your face/neck. Please follow these instructions carefully:  1.  Shower with CHG Soap the night before surgery and the  morning of Surgery.  2.  If you choose to wash your hair, wash your hair first as usual with your  normal  shampoo.  3.  After you shampoo, rinse your hair and body thoroughly to remove the  shampoo.                           4.  Use CHG as you would any other liquid soap.  You can apply chg directly  to the skin and wash                       Gently with a scrungie or clean  washcloth.  5.  Apply the CHG Soap to your body ONLY FROM THE NECK DOWN.   Do not use on face/ open                           Wound or open sores. Avoid contact with eyes, ears mouth and genitals (private parts).                       Wash face,  Genitals (private parts) with your normal soap.             6.  Wash thoroughly, paying special attention to the area where your surgery  will be performed.  7.  Thoroughly rinse your body with warm water from the neck down.  8.  DO NOT shower/wash with your normal soap after using and rinsing off  the CHG Soap.  9.  Pat yourself dry with a clean towel.            10.  Wear clean pajamas.            11.  Place clean sheets on your bed the night of your first shower and do not  sleep with pets. Day of Surgery : Do not apply any lotions/deodorants the morning of surgery.  Please wear clean clothes to the hospital/surgery center.  FAILURE TO FOLLOW THESE INSTRUCTIONS MAY RESULT IN THE CANCELLATION OF YOUR SURGERY PATIENT SIGNATURE_________________________________  NURSE SIGNATURE__________________________________  ________________________________________________________________________   Carol [name]  An incentive spirometer is a tool that can help keep your lungs clear and active. This tool measures how well you are filling your lungs with each breath. Taking long deep breaths may help reverse or decrease the chance of developing breathing (pulmonary) problems (especially infection) following:  A long period of time when you are unable to move or be active. BEFORE THE PROCEDURE   If the spirometer includes an indicator to show your best effort, your nurse or respiratory therapist will set it to a desired goal.  If possible, sit up straight or lean slightly forward. Try not to slouch.  Hold the incentive spirometer in an upright position. INSTRUCTIONS FOR USE  1. Sit on the edge of your bed if possible, or sit up as far  as you can in bed or on a chair. 2. Hold the incentive spirometer in an upright position. 3. Breathe out normally. 4. Place the mouthpiece in your mouth and seal your lips tightly around it. 5. Breathe in slowly and as deeply as possible, raising the piston or the ball toward the top of the column. 6. Hold your breath for 3-5 seconds or for as long as possible. Allow the piston or ball to fall to the bottom of the column. 7. Remove the mouthpiece from your mouth and breathe out normally. 8. Rest for a few seconds and repeat Steps 1 through 7 at least 10 times every 1-2 hours when you are awake. Take your time and take a few normal breaths between deep breaths. 9. The spirometer may include an indicator to show your best effort. Use the indicator as a goal to work toward during each repetition. 10. After each set of 10 deep breaths, practice coughing to be sure your lungs are clear. If you have an incision (the cut made at the time of surgery), support your incision when coughing by placing a pillow or rolled up towels firmly against it. Once you are able to get out of bed, walk around indoors and cough well. You may stop using the incentive spirometer when instructed by your caregiver.  RISKS AND COMPLICATIONS  Take your time so you do not get dizzy or light-headed.  If you are in pain, you may need to take or ask for pain medication before doing incentive spirometry. It is harder to take a deep breath if you are having pain. AFTER USE  Rest and breathe slowly and easily.  It can be helpful to keep track of a log of your progress. Your caregiver can provide you with a simple table to help with this. If you are using the spirometer at home, follow these instructions: [place] IF:   You are having difficultly using the spirometer.  You have trouble using the spirometer as often as instructed.  Your pain medication is not giving enough relief while using the spirometer.  You  develop fever of 100.5 F (38.1 C) or higher. SEEK IMMEDIATE MEDICAL CARE IF:   You cough up bloody sputum that had not been present before.  You develop fever of 102 F (38.9 C) or greater.  You develop worsening pain at or near the incision site. MAKE SURE YOU:   Understand these instructions.  Will watch your condition.  Will get help right away if you are not doing well or get worse. Document Released: 07/24/2006 Document Revised: 06/05/2011 Document Reviewed: 09/24/2006 ExitCare Patient Information 2014 ExitCare, [place].   ________________________________________________________________________  WHAT IS A BLOOD TRANSFUSION? Blood Transfusion Information  A transfusion is the replacement of blood or some of its parts. Blood is made up of multiple cells which provide different functions.  Red blood cells carry oxygen and are used for blood loss replacement.  White blood cells fight against infection.  Platelets control bleeding.  Plasma helps clot blood.  Other blood products are available for specialized needs, such as hemophilia or other clotting disorders. BEFORE THE TRANSFUSION  Who gives blood for transfusions?   Healthy volunteers who are fully evaluated to make sure their blood is safe. This is blood bank blood. Transfusion therapy is the safest it has ever been in the practice of medicine. Before blood is taken from a donor, a complete history is taken to make sure that person has no history of diseases nor engages in risky social behavior (examples are intravenous drug use or sexual activity with multiple partners). The donor's travel history is screened to minimize risk of transmitting infections, such as malaria. The donated blood is tested for signs of infectious diseases, such as HIV and hepatitis. The blood is then tested to be sure it is compatible with you in order to minimize the chance of a transfusion reaction. If you or a relative donates blood, this is  often done in anticipation of surgery and is not appropriate for emergency situations. It takes many days to process the donated blood. RISKS AND COMPLICATIONS Although transfusion therapy is very safe and saves many lives, the main dangers of transfusion include:   Getting an infectious disease.  Developing a transfusion reaction. This is an allergic reaction to something in the blood you were given. Every precaution is taken to prevent this. The decision to have a blood transfusion has been considered carefully by your caregiver before blood is given. Blood is not given unless the benefits outweigh the risks. AFTER THE TRANSFUSION  Right after receiving a blood transfusion, you will usually feel much better and more energetic. This is especially true if your red blood cells have gotten low (anemic). The transfusion raises the level of the red blood cells which carry oxygen, and this usually causes an energy increase.  The nurse administering the transfusion will monitor you carefully for complications. HOME CARE INSTRUCTIONS  No special instructions are needed after a transfusion. You may find your energy is better. Speak with your caregiver about any limitations on activity for underlying diseases you may have. SEEK MEDICAL CARE IF:   Your condition is not improving after your transfusion.  You develop redness or irritation at the intravenous (IV) site. SEEK IMMEDIATE MEDICAL CARE IF:  Any of the following symptoms occur over the next 12 hours:  Shaking chills.  You have a temperature by mouth above 102 F (38.9 C), not controlled by medicine.  Chest, back, or muscle pain.  People around you feel you are not acting correctly or are confused.  Shortness of breath  or difficulty breathing.  Dizziness and fainting.  You get a rash or develop hives.  You have a decrease in urine output.  Your urine turns a dark color or changes to pink, red, or brown. Any of the following  symptoms occur over the next 10 days:  You have a temperature by mouth above 102 F (38.9 C), not controlled by medicine.  Shortness of breath.  Weakness after normal activity.  The white part of the eye turns yellow (jaundice).  You have a decrease in the amount of urine or are urinating less often.  Your urine turns a dark color or changes to pink, red, or brown. Document Released: 03/10/2000 Document Revised: 06/05/2011 Document Reviewed: 10/28/2007 [facility] Patient Information 2014 [place], [place].  _______________________________________________________________________

## 2019-08-20 ENCOUNTER — Encounter (HOSPITAL_COMMUNITY)
Admission: RE | Admit: 2019-08-20 | Discharge: 2019-08-20 | Disposition: A | Discharge status: Home / Self Care | Payer: PRIVATE HEALTH INSURANCE | Source: Ambulatory Visit | Attending: Orthopedic Surgery | Admitting: Orthopedic Surgery

## 2019-08-20 ENCOUNTER — Ambulatory Visit (HOSPITAL_COMMUNITY)
Admission: RE | Admit: 2019-08-20 | Discharge: 2019-08-20 | Disposition: A | Discharge status: Home / Self Care | Payer: PRIVATE HEALTH INSURANCE | Source: Ambulatory Visit | Attending: Orthopedic Surgery | Admitting: Orthopedic Surgery

## 2019-08-20 ENCOUNTER — Encounter (HOSPITAL_COMMUNITY): Payer: Self-pay

## 2019-08-20 ENCOUNTER — Other Ambulatory Visit: Payer: Self-pay

## 2019-08-20 DIAGNOSIS — Z01818 Encounter for other preprocedural examination: Secondary | ICD-10-CM | POA: Diagnosis not present

## 2019-08-20 LAB — URINALYSIS, ROUTINE W REFLEX MICROSCOPIC
Bilirubin Urine: NEGATIVE
Glucose, UA: NEGATIVE mg/dL
Hgb urine dipstick: NEGATIVE
Ketones, ur: NEGATIVE mg/dL
Leukocytes,Ua: NEGATIVE
Nitrite: NEGATIVE
Protein, ur: NEGATIVE mg/dL
Specific Gravity, Urine: 1.017 (ref 1.005–1.030)
pH: 5 (ref 5.0–8.0)

## 2019-08-20 LAB — CBC WITH DIFFERENTIAL/PLATELET
Abs Immature Granulocytes: 0.02 10*3/uL (ref 0.00–0.07)
Basophils Absolute: 0 10*3/uL (ref 0.0–0.1)
Basophils Relative: 0 %
Eosinophils Absolute: 0 10*3/uL (ref 0.0–0.5)
Eosinophils Relative: 1 %
HCT: 41.9 % (ref 36.0–46.0)
Hemoglobin: 13.6 g/dL (ref 12.0–15.0)
Immature Granulocytes: 0 %
Lymphocytes Relative: 28 %
Lymphs Abs: 1.6 10*3/uL (ref 0.7–4.0)
MCH: 31.5 pg (ref 26.0–34.0)
MCHC: 32.5 g/dL (ref 30.0–36.0)
MCV: 97 fL (ref 80.0–100.0)
Monocytes Absolute: 0.3 10*3/uL (ref 0.1–1.0)
Monocytes Relative: 6 %
Neutro Abs: 3.8 10*3/uL (ref 1.7–7.7)
Neutrophils Relative %: 65 %
Platelets: 243 10*3/uL (ref 150–400)
RBC: 4.32 MIL/uL (ref 3.87–5.11)
RDW: 11.9 % (ref 11.5–15.5)
WBC: 5.8 10*3/uL (ref 4.0–10.5)
nRBC: 0 % (ref 0.0–0.2)

## 2019-08-20 LAB — PROTIME-INR
INR: 1 (ref 0.8–1.2)
Prothrombin Time: 12.8 seconds (ref 11.4–15.2)

## 2019-08-20 LAB — BASIC METABOLIC PANEL
Anion gap: 10 (ref 5–15)
BUN: 15 mg/dL (ref 8–23)
CO2: 26 mmol/L (ref 22–32)
Calcium: 9.4 mg/dL (ref 8.9–10.3)
Chloride: 103 mmol/L (ref 98–111)
Creatinine, Ser: 0.94 mg/dL (ref 0.44–1.00)
GFR calc Af Amer: >60 mL/min (ref >60)
GFR calc non Af Amer: >60 mL/min (ref >60)
Glucose, Bld: 122 mg/dL — ABNORMAL HIGH (ref 70–99)
Potassium: 3.4 mmol/L — ABNORMAL LOW (ref 3.5–5.1)
Sodium: 139 mmol/L (ref 135–145)

## 2019-08-20 LAB — TYPE AND SCREEN
ABO/RH(D): O POS
Antibody Screen: NEGATIVE

## 2019-08-20 LAB — APTT: aPTT: 31 seconds (ref 24–36)

## 2019-08-20 LAB — ABO/RH: ABO/RH(D): O POS

## 2019-08-20 LAB — SURGICAL PCR SCREEN
MRSA, PCR: NEGATIVE
Staphylococcus aureus: NEGATIVE

## 2019-08-28 ENCOUNTER — Other Ambulatory Visit (HOSPITAL_COMMUNITY)
Admission: RE | Admit: 2019-08-28 | Discharge: 2019-08-28 | Disposition: A | Discharge status: Home / Self Care | Payer: PRIVATE HEALTH INSURANCE | Source: Ambulatory Visit | Attending: Orthopedic Surgery | Admitting: Orthopedic Surgery

## 2019-08-28 DIAGNOSIS — Z20822 Contact with and (suspected) exposure to covid-19: Secondary | ICD-10-CM | POA: Diagnosis not present

## 2019-08-28 DIAGNOSIS — Z01812 Encounter for preprocedural laboratory examination: Secondary | ICD-10-CM | POA: Insufficient documentation

## 2019-08-28 LAB — SARS CORONAVIRUS 2 (TAT 6-24 HRS): SARS Coronavirus 2: NEGATIVE

## 2019-08-29 DIAGNOSIS — M1711 Unilateral primary osteoarthritis, right knee: Secondary | ICD-10-CM | POA: Diagnosis present
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
## 2019-08-29 NOTE — Progress Notes (Signed)
Pt made aware that her surgery time for 09-01-19 is now 9:16 AM, in lieu of 7:15 AM. Pt to arrive to [facility] hospital at 6:45 AM, and drink Ensure supplement by 6:15 AM..All other prior instructions given at PAT appt remains the same.... Patient verbalized understanding.

## 2019-08-31 MED ORDER — TRANEXAMIC ACID 1000 MG/10ML IV SOLN
2000.0000 mg | INTRAVENOUS | Status: DC
  Filled 2019-08-31: qty 20

## 2019-08-31 MED ORDER — BUPIVACAINE LIPOSOME 1.3 % IJ SUSP
20.0000 mL | Freq: Once | INTRAMUSCULAR | Status: DC
  Filled 2019-08-31: qty 20

## 2019-09-01 ENCOUNTER — Observation Stay (HOSPITAL_COMMUNITY)
Admission: RE | Admit: 2019-09-01 | Discharge: 2019-09-02 | Disposition: A | Discharge status: Home / Self Care | Payer: No Typology Code available for payment source | Source: Other Acute Inpatient Hospital | Attending: Orthopedic Surgery | Admitting: Orthopedic Surgery

## 2019-09-01 ENCOUNTER — Ambulatory Visit (HOSPITAL_COMMUNITY): Payer: No Typology Code available for payment source | Admitting: Physician Assistant

## 2019-09-01 ENCOUNTER — Ambulatory Visit (HOSPITAL_COMMUNITY): Payer: No Typology Code available for payment source | Admitting: Certified Registered"

## 2019-09-01 ENCOUNTER — Encounter (HOSPITAL_COMMUNITY): Payer: Self-pay | Admitting: Orthopedic Surgery

## 2019-09-01 ENCOUNTER — Encounter (HOSPITAL_COMMUNITY)
Admission: RE | Disposition: A | Discharge status: Home / Self Care | Payer: Self-pay | Source: Other Acute Inpatient Hospital | Attending: Orthopedic Surgery

## 2019-09-01 ENCOUNTER — Other Ambulatory Visit: Payer: Self-pay

## 2019-09-01 DIAGNOSIS — Z833 Family history of diabetes mellitus: Secondary | ICD-10-CM | POA: Diagnosis not present

## 2019-09-01 DIAGNOSIS — M1711 Unilateral primary osteoarthritis, right knee: Secondary | ICD-10-CM | POA: Diagnosis present

## 2019-09-01 DIAGNOSIS — Z8349 Family history of other endocrine, nutritional and metabolic diseases: Secondary | ICD-10-CM | POA: Insufficient documentation

## 2019-09-01 DIAGNOSIS — Z96652 Presence of left artificial knee joint: Secondary | ICD-10-CM | POA: Insufficient documentation

## 2019-09-01 DIAGNOSIS — Z7982 Long term (current) use of aspirin: Secondary | ICD-10-CM | POA: Diagnosis not present

## 2019-09-01 DIAGNOSIS — Z8379 Family history of other diseases of the digestive system: Secondary | ICD-10-CM | POA: Diagnosis not present

## 2019-09-01 DIAGNOSIS — Z96651 Presence of right artificial knee joint: Secondary | ICD-10-CM

## 2019-09-01 DIAGNOSIS — Z791 Long term (current) use of non-steroidal anti-inflammatories (NSAID): Secondary | ICD-10-CM | POA: Insufficient documentation

## 2019-09-01 DIAGNOSIS — Z79899 Other long term (current) drug therapy: Secondary | ICD-10-CM | POA: Insufficient documentation

## 2019-09-01 DIAGNOSIS — Z809 Family history of malignant neoplasm, unspecified: Secondary | ICD-10-CM | POA: Insufficient documentation

## 2019-09-01 DIAGNOSIS — Z8249 Family history of ischemic heart disease and other diseases of the circulatory system: Secondary | ICD-10-CM | POA: Diagnosis not present

## 2019-09-01 DIAGNOSIS — K219 Gastro-esophageal reflux disease without esophagitis: Secondary | ICD-10-CM | POA: Insufficient documentation

## 2019-09-01 DIAGNOSIS — Z825 Family history of asthma and other chronic lower respiratory diseases: Secondary | ICD-10-CM | POA: Insufficient documentation

## 2019-09-01 DIAGNOSIS — Z6835 Body mass index (BMI) 35.0-35.9, adult: Secondary | ICD-10-CM | POA: Diagnosis not present

## 2019-09-01 DIAGNOSIS — E669 Obesity, unspecified: Secondary | ICD-10-CM | POA: Insufficient documentation

## 2019-09-01 DIAGNOSIS — D649 Anemia, unspecified: Secondary | ICD-10-CM | POA: Insufficient documentation

## 2019-09-01 DIAGNOSIS — I341 Nonrheumatic mitral (valve) prolapse: Secondary | ICD-10-CM | POA: Insufficient documentation

## 2019-09-01 DIAGNOSIS — I10 Essential (primary) hypertension: Secondary | ICD-10-CM | POA: Insufficient documentation

## 2019-09-01 HISTORY — PX: TOTAL KNEE ARTHROPLASTY: SHX125

## 2019-09-01 SURGERY — ARTHROPLASTY, KNEE, TOTAL
Anesthesia: Spinal | Site: Knee | Laterality: Right

## 2019-09-01 MED ORDER — PHENOL 1.4 % MT LIQD: 1.0000 | OROMUCOSAL | Status: DC | PRN

## 2019-09-01 MED ORDER — ONDANSETRON HCL 4 MG/2ML IJ SOLN
INTRAMUSCULAR | Status: AC
  Filled 2019-09-01: qty 2

## 2019-09-01 MED ORDER — MIDAZOLAM HCL 2 MG/2ML IJ SOLN
0.5000 mg | Freq: Once | INTRAMUSCULAR | Status: AC
  Administered 2019-09-01 (×2): 1 mg via INTRAVENOUS
  Filled 2019-09-01: qty 2

## 2019-09-01 MED ORDER — ONDANSETRON HCL 4 MG PO TABS: 4.0000 mg | ORAL_TABLET | Freq: Four times a day (QID) | ORAL | Status: DC | PRN

## 2019-09-01 MED ORDER — MENTHOL 3 MG MT LOZG: 1.0000 | LOZENGE | OROMUCOSAL | Status: DC | PRN

## 2019-09-01 MED ORDER — PANTOPRAZOLE SODIUM 40 MG PO TBEC
40.0000 mg | DELAYED_RELEASE_TABLET | Freq: Every day | ORAL | Status: DC
  Administered 2019-09-01 – 2019-09-02 (×2): 40 mg via ORAL
  Filled 2019-09-01 (×2): qty 1

## 2019-09-01 MED ORDER — LISINOPRIL 10 MG PO TABS
10.0000 mg | ORAL_TABLET | Freq: Every day | ORAL | Status: DC
  Filled 2019-09-01: qty 1

## 2019-09-01 MED ORDER — PROPOFOL 500 MG/50ML IV EMUL
INTRAVENOUS | Status: DC | PRN
  Administered 2019-09-01: 125 ug/kg/min via INTRAVENOUS

## 2019-09-01 MED ORDER — BUPIVACAINE HCL 0.25 % IJ SOLN
INTRAMUSCULAR | Status: DC | PRN
  Administered 2019-09-01: 30 mL

## 2019-09-01 MED ORDER — BUPIVACAINE HCL (PF) 0.25 % IJ SOLN
INTRAMUSCULAR | Status: AC
  Filled 2019-09-01: qty 30

## 2019-09-01 MED ORDER — DEXAMETHASONE SODIUM PHOSPHATE 10 MG/ML IJ SOLN
INTRAMUSCULAR | Status: DC | PRN
  Administered 2019-09-01: 8 mg via INTRAVENOUS

## 2019-09-01 MED ORDER — PROPOFOL 500 MG/50ML IV EMUL
INTRAVENOUS | Status: AC
  Filled 2019-09-01: qty 50

## 2019-09-01 MED ORDER — LORATADINE 10 MG PO TABS
10.0000 mg | ORAL_TABLET | Freq: Every day | ORAL | Status: DC
  Administered 2019-09-01 – 2019-09-02 (×2): 10 mg via ORAL
  Filled 2019-09-01 (×2): qty 1

## 2019-09-01 MED ORDER — POLYETHYLENE GLYCOL 3350 17 G PO PACK: 17.0000 g | PACK | Freq: Every day | ORAL | Status: DC | PRN

## 2019-09-01 MED ORDER — PHENYLEPHRINE HCL (PRESSORS) 10 MG/ML IV SOLN
INTRAVENOUS | Status: AC
  Filled 2019-09-01: qty 1

## 2019-09-01 MED ORDER — BUPIVACAINE LIPOSOME 1.3 % IJ SUSP
INTRAMUSCULAR | Status: DC | PRN
  Administered 2019-09-01: 20 mL

## 2019-09-01 MED ORDER — ONDANSETRON HCL 4 MG/2ML IJ SOLN
INTRAMUSCULAR | Status: DC | PRN
  Administered 2019-09-01: 4 mg via INTRAVENOUS

## 2019-09-01 MED ORDER — PROMETHAZINE HCL 25 MG/ML IJ SOLN: 6.2500 mg | INTRAMUSCULAR | Status: DC | PRN

## 2019-09-01 MED ORDER — HYDROMORPHONE HCL 1 MG/ML IJ SOLN: 0.2500 mg | INTRAMUSCULAR | Status: DC | PRN

## 2019-09-01 MED ORDER — CEFAZOLIN SODIUM-DEXTROSE 2-4 GM/100ML-% IV SOLN
2.0000 g | INTRAVENOUS | Status: AC
  Administered 2019-09-01: 2 g via INTRAVENOUS
  Filled 2019-09-01: qty 100

## 2019-09-01 MED ORDER — FENTANYL CITRATE (PF) 100 MCG/2ML IJ SOLN
25.0000 ug | Freq: Once | INTRAMUSCULAR | Status: AC
  Administered 2019-09-01 (×2): 50 ug via INTRAVENOUS
  Filled 2019-09-01: qty 2

## 2019-09-01 MED ORDER — TRANEXAMIC ACID-NACL 1000-0.7 MG/100ML-% IV SOLN
1000.0000 mg | Freq: Once | INTRAVENOUS | Status: AC
  Administered 2019-09-01: 1000 mg via INTRAVENOUS
  Filled 2019-09-01: qty 100

## 2019-09-01 MED ORDER — QUINAPRIL-HYDROCHLOROTHIAZIDE 10-12.5 MG PO TABS: 1.0000 | ORAL_TABLET | Freq: Every day | ORAL | Status: DC

## 2019-09-01 MED ORDER — DOCUSATE SODIUM 100 MG PO CAPS
100.0000 mg | ORAL_CAPSULE | Freq: Two times a day (BID) | ORAL | Status: DC
  Administered 2019-09-01 – 2019-09-02 (×2): 100 mg via ORAL
  Filled 2019-09-01 (×2): qty 1

## 2019-09-01 MED ORDER — CHLORHEXIDINE GLUCONATE 0.12 % MT SOLN
15.0000 mL | Freq: Once | OROMUCOSAL | Status: AC
  Administered 2019-09-01: 15 mL via OROMUCOSAL

## 2019-09-01 MED ORDER — FAMOTIDINE 20 MG PO TABS: 20.0000 mg | ORAL_TABLET | Freq: Every day | ORAL | Status: DC | PRN

## 2019-09-01 MED ORDER — MEPERIDINE HCL 50 MG/ML IJ SOLN: 6.2500 mg | INTRAMUSCULAR | Status: DC | PRN

## 2019-09-01 MED ORDER — PROPOFOL 10 MG/ML IV BOLUS
INTRAVENOUS | Status: AC
  Filled 2019-09-01: qty 20

## 2019-09-01 MED ORDER — PROPOFOL 10 MG/ML IV BOLUS
INTRAVENOUS | Status: DC | PRN
  Administered 2019-09-01 (×3): 20 mg via INTRAVENOUS

## 2019-09-01 MED ORDER — WATER FOR IRRIGATION, STERILE IR SOLN
Status: DC | PRN
  Administered 2019-09-01: 2000 mL

## 2019-09-01 MED ORDER — BISACODYL 5 MG PO TBEC: 5.0000 mg | DELAYED_RELEASE_TABLET | Freq: Every day | ORAL | Status: DC | PRN

## 2019-09-01 MED ORDER — CLONIDINE HCL (ANALGESIA) 100 MCG/ML EP SOLN
EPIDURAL | Status: DC | PRN
Start: 2019-09-01 — End: 2019-09-01
  Administered 2019-09-01: 100 ug

## 2019-09-01 MED ORDER — POTASSIUM CHLORIDE CRYS ER 10 MEQ PO TBCR
10.0000 meq | EXTENDED_RELEASE_TABLET | Freq: Every day | ORAL | Status: DC
  Administered 2019-09-01 – 2019-09-02 (×2): 10 meq via ORAL
  Filled 2019-09-01 (×2): qty 1

## 2019-09-01 MED ORDER — OXYCODONE HCL 5 MG PO TABS
5.0000 mg | ORAL_TABLET | ORAL | Status: DC | PRN
  Administered 2019-09-01 (×2): 5 mg via ORAL
  Administered 2019-09-02 (×4): 10 mg via ORAL
  Filled 2019-09-01 (×3): qty 2
  Filled 2019-09-01: qty 1
  Filled 2019-09-01 (×2): qty 2

## 2019-09-01 MED ORDER — GABAPENTIN 100 MG PO CAPS
100.0000 mg | ORAL_CAPSULE | Freq: Three times a day (TID) | ORAL | Status: DC
  Administered 2019-09-01 – 2019-09-02 (×4): 100 mg via ORAL
  Filled 2019-09-01 (×4): qty 1

## 2019-09-01 MED ORDER — DEXAMETHASONE SODIUM PHOSPHATE 10 MG/ML IJ SOLN
INTRAMUSCULAR | Status: AC
  Filled 2019-09-01: qty 1

## 2019-09-01 MED ORDER — POLYVINYL ALCOHOL 1.4 % OP SOLN
1.0000 [drp] | OPHTHALMIC | Status: DC | PRN
  Filled 2019-09-01: qty 15

## 2019-09-01 MED ORDER — POVIDONE-IODINE 10 % EX SWAB
2.0000 "application " | Freq: Once | CUTANEOUS | Status: AC
  Administered 2019-09-01: 2 via TOPICAL

## 2019-09-01 MED ORDER — ORAL CARE MOUTH RINSE: 15.0000 mL | Freq: Once | OROMUCOSAL | Status: AC

## 2019-09-01 MED ORDER — ACETAMINOPHEN 325 MG PO TABS: 325.0000 mg | ORAL_TABLET | Freq: Four times a day (QID) | ORAL | Status: DC | PRN

## 2019-09-01 MED ORDER — ROPIVACAINE HCL 7.5 MG/ML IJ SOLN
INTRAMUSCULAR | Status: DC | PRN
  Administered 2019-09-01 (×4): 5 mL via PERINEURAL

## 2019-09-01 MED ORDER — PROPOFOL 1000 MG/100ML IV EMUL
INTRAVENOUS | Status: AC
  Filled 2019-09-01: qty 100

## 2019-09-01 MED ORDER — CELECOXIB 200 MG PO CAPS
200.0000 mg | ORAL_CAPSULE | Freq: Two times a day (BID) | ORAL | Status: DC
  Administered 2019-09-01 – 2019-09-02 (×2): 200 mg via ORAL
  Filled 2019-09-01 (×2): qty 1

## 2019-09-01 MED ORDER — HYDROMORPHONE HCL 1 MG/ML IJ SOLN
0.5000 mg | INTRAMUSCULAR | Status: DC | PRN
  Administered 2019-09-02: 0.5 mg via INTRAVENOUS
  Filled 2019-09-01: qty 1

## 2019-09-01 MED ORDER — BUPIVACAINE IN DEXTROSE 0.75-8.25 % IT SOLN
INTRATHECAL | Status: DC | PRN
  Administered 2019-09-01: 1.6 mL via INTRATHECAL

## 2019-09-01 MED ORDER — ALUM & MAG HYDROXIDE-SIMETH 200-200-20 MG/5ML PO SUSP: 30.0000 mL | ORAL | Status: DC | PRN

## 2019-09-01 MED ORDER — FLEET ENEMA 7-19 GM/118ML RE ENEM: 1.0000 | ENEMA | Freq: Once | RECTAL | Status: DC | PRN

## 2019-09-01 MED ORDER — ASPIRIN 81 MG PO CHEW
81.0000 mg | CHEWABLE_TABLET | Freq: Two times a day (BID) | ORAL | Status: DC
  Administered 2019-09-01 – 2019-09-02 (×2): 81 mg via ORAL
  Filled 2019-09-01 (×2): qty 1

## 2019-09-01 MED ORDER — LACTATED RINGERS IV SOLN: INTRAVENOUS | Status: DC

## 2019-09-01 MED ORDER — TRANEXAMIC ACID-NACL 1000-0.7 MG/100ML-% IV SOLN
1000.0000 mg | INTRAVENOUS | Status: AC
  Administered 2019-09-01: 1000 mg via INTRAVENOUS
  Filled 2019-09-01: qty 100

## 2019-09-01 MED ORDER — HYDROCHLOROTHIAZIDE 12.5 MG PO CAPS
12.5000 mg | ORAL_CAPSULE | Freq: Every day | ORAL | Status: DC
  Filled 2019-09-01: qty 1

## 2019-09-01 MED ORDER — DIPHENHYDRAMINE HCL 12.5 MG/5ML PO ELIX: 12.5000 mg | ORAL_SOLUTION | ORAL | Status: DC | PRN

## 2019-09-01 MED ORDER — KCL IN DEXTROSE-NACL 20-5-0.45 MEQ/L-%-% IV SOLN
INTRAVENOUS | Status: DC
  Filled 2019-09-01 (×2): qty 1000

## 2019-09-01 MED ORDER — METOCLOPRAMIDE HCL 5 MG PO TABS: 5.0000 mg | ORAL_TABLET | Freq: Three times a day (TID) | ORAL | Status: DC | PRN

## 2019-09-01 MED ORDER — SODIUM CHLORIDE 0.9 % IR SOLN
Status: DC | PRN
  Administered 2019-09-01 (×2): 1000 mL

## 2019-09-01 MED ORDER — SODIUM CHLORIDE (PF) 0.9 % IJ SOLN
INTRAMUSCULAR | Status: DC | PRN
  Administered 2019-09-01: 50 mL

## 2019-09-01 MED ORDER — POLYETHYLENE GLYCOL 400 0.25 % OP SOLN: Freq: Every day | OPHTHALMIC | Status: DC | PRN

## 2019-09-01 MED ORDER — TRANEXAMIC ACID 1000 MG/10ML IV SOLN
INTRAVENOUS | Status: DC | PRN
  Administered 2019-09-01: 2000 mg via TOPICAL

## 2019-09-01 MED ORDER — ONDANSETRON HCL 4 MG/2ML IJ SOLN: 4.0000 mg | Freq: Four times a day (QID) | INTRAMUSCULAR | Status: DC | PRN

## 2019-09-01 MED ORDER — HYDROMORPHONE HCL 1 MG/ML IJ SOLN
INTRAMUSCULAR | Status: AC
  Administered 2019-09-01: 0.5 mg via INTRAVENOUS
  Filled 2019-09-01: qty 1

## 2019-09-01 MED ORDER — METOCLOPRAMIDE HCL 5 MG/ML IJ SOLN: 5.0000 mg | Freq: Three times a day (TID) | INTRAMUSCULAR | Status: DC | PRN

## 2019-09-01 MED ORDER — KETOROLAC TROMETHAMINE 30 MG/ML IJ SOLN
30.0000 mg | Freq: Once | INTRAMUSCULAR | Status: AC | PRN
  Administered 2019-09-01: 30 mg via INTRAVENOUS

## 2019-09-01 MED ORDER — PHENYLEPHRINE HCL-NACL 10-0.9 MG/250ML-% IV SOLN
INTRAVENOUS | Status: DC | PRN
  Administered 2019-09-01: 30 ug/min via INTRAVENOUS

## 2019-09-01 MED ORDER — ATORVASTATIN CALCIUM 20 MG PO TABS
20.0000 mg | ORAL_TABLET | Freq: Every day | ORAL | Status: DC
  Administered 2019-09-02: 20 mg via ORAL
  Filled 2019-09-01: qty 1

## 2019-09-01 MED ORDER — ROPIVACAINE HCL 5 MG/ML IJ SOLN
INTRAMUSCULAR | Status: DC | PRN
Start: 2019-09-01 — End: 2019-09-01
  Administered 2019-09-01 (×2): 5 mL via PERINEURAL

## 2019-09-01 MED ORDER — KETOROLAC TROMETHAMINE 30 MG/ML IJ SOLN
INTRAMUSCULAR | Status: AC
  Filled 2019-09-01: qty 1

## 2019-09-01 MED ORDER — SODIUM CHLORIDE (PF) 0.9 % IJ SOLN
INTRAMUSCULAR | Status: AC
  Filled 2019-09-01: qty 50

## 2019-09-01 SURGICAL SUPPLY — 56 items
ATTUNE MED DOME PAT 38 KNEE (Knees) ×1 IMPLANT
ATTUNE MED DOME PAT 38MM KNEE (Knees) ×1 IMPLANT
ATTUNE PS FEM RT SZ 6 CEM KNEE (Femur) ×2 IMPLANT
ATTUNE PSRP INSR SZ6 5 KNEE (Insert) ×1 IMPLANT
ATTUNE PSRP INSR SZ6 5MM KNEE (Insert) ×1 IMPLANT
BAG DECANTER FOR FLEXI CONT (MISCELLANEOUS) ×3 IMPLANT
BAG SPEC THK2 15X12 ZIP CLS (MISCELLANEOUS) ×1
BAG ZIPLOCK 12X15 (MISCELLANEOUS) ×3 IMPLANT
BASE TIBIA ATTUNE KNEE SYS SZ6 (Knees) IMPLANT
BLADE SAG 18X100X1.27 (BLADE) ×3 IMPLANT
BLADE SAW SGTL 11.0X1.19X90.0M (BLADE) ×3 IMPLANT
BLADE SURG SZ10 CARB STEEL (BLADE) ×6 IMPLANT
BNDG ELASTIC 6X10 VLCR STRL LF (GAUZE/BANDAGES/DRESSINGS) ×3 IMPLANT
BNDG ELASTIC 6X15 VLCR STRL LF (GAUZE/BANDAGES/DRESSINGS) ×2 IMPLANT
BOWL SMART MIX CTS (DISPOSABLE) ×3 IMPLANT
CEMENT HV SMART SET (Cement) ×6 IMPLANT
COVER SURGICAL LIGHT HANDLE (MISCELLANEOUS) ×3 IMPLANT
COVER WAND RF STERILE (DRAPES) IMPLANT
CUFF TOURN SGL QUICK 34 (TOURNIQUET CUFF) ×3
CUFF TRNQT CYL 34X4.125X (TOURNIQUET CUFF) ×1 IMPLANT
DECANTER SPIKE VIAL GLASS SM (MISCELLANEOUS) ×9 IMPLANT
DRAPE U-SHAPE 47X51 STRL (DRAPES) ×3 IMPLANT
DRSG AQUACEL AG ADV 3.5X10 (GAUZE/BANDAGES/DRESSINGS) ×3 IMPLANT
DURAPREP 26ML APPLICATOR (WOUND CARE) ×3 IMPLANT
ELECT REM PT RETURN 15FT ADLT (MISCELLANEOUS) ×3 IMPLANT
GLOVE BIO SURGEON STRL SZ7.5 (GLOVE) ×3 IMPLANT
GLOVE BIO SURGEON STRL SZ8.5 (GLOVE) ×3 IMPLANT
GLOVE BIOGEL PI IND STRL 8 (GLOVE) ×1 IMPLANT
GLOVE BIOGEL PI IND STRL 9 (GLOVE) ×1 IMPLANT
GLOVE BIOGEL PI INDICATOR 8 (GLOVE) ×2
GLOVE BIOGEL PI INDICATOR 9 (GLOVE) ×2
GOWN STRL REUS W/TWL XL LVL3 (GOWN DISPOSABLE) ×6 IMPLANT
HANDPIECE INTERPULSE COAX TIP (DISPOSABLE) ×3
HOOD PEEL AWAY FLYTE STAYCOOL (MISCELLANEOUS) ×11 IMPLANT
KIT TURNOVER KIT A (KITS) IMPLANT
NDL HYPO 21X1.5 SAFETY (NEEDLE) ×2 IMPLANT
NEEDLE HYPO 21X1.5 SAFETY (NEEDLE) ×6 IMPLANT
NS IRRIG 1000ML POUR BTL (IV SOLUTION) ×3 IMPLANT
PACK ICE MAXI GEL EZY WRAP (MISCELLANEOUS) ×3 IMPLANT
PACK TOTAL KNEE CUSTOM (KITS) ×3 IMPLANT
PENCIL SMOKE EVACUATOR (MISCELLANEOUS) IMPLANT
PIN DRILL FIX HALF THREAD (BIT) ×2 IMPLANT
PIN STEINMAN FIXATION KNEE (PIN) ×2 IMPLANT
PROTECTOR NERVE ULNAR (MISCELLANEOUS) ×3 IMPLANT
SET HNDPC FAN SPRY TIP SCT (DISPOSABLE) ×1 IMPLANT
SUT VIC AB 1 CTX 36 (SUTURE) ×3
SUT VIC AB 1 CTX36XBRD ANBCTR (SUTURE) ×1 IMPLANT
SUT VIC AB 3-0 CT1 27 (SUTURE) ×9
SUT VIC AB 3-0 CT1 TAPERPNT 27 (SUTURE) ×3 IMPLANT
SYR CONTROL 10ML LL (SYRINGE) ×6 IMPLANT
TIBIA ATTUNE KNEE SYS BASE SZ6 (Knees) ×3 IMPLANT
TRAY FOLEY MTR SLVR 14FR STAT (SET/KITS/TRAYS/PACK) ×2 IMPLANT
TRAY FOLEY MTR SLVR 16FR STAT (SET/KITS/TRAYS/PACK) ×1 IMPLANT
WATER STERILE IRR 1000ML POUR (IV SOLUTION) ×6 IMPLANT
WRAP KNEE MAXI GEL POST OP (GAUZE/BANDAGES/DRESSINGS) ×2 IMPLANT
YANKAUER SUCT BULB TIP 10FT TU (MISCELLANEOUS) ×3 IMPLANT

## 2019-09-01 NOTE — Anesthesia Preprocedure Evaluation (Addendum)
Anesthesia Evaluation  Patient identified by MRN, date of birth, ID band Patient awake    History of Anesthesia Complications (+) PONV and history of anesthetic complications  Airway Mallampati: I       Dental no notable dental hx. (+) Teeth Intact   Pulmonary neg pulmonary ROS,    Pulmonary exam normal breath sounds clear to auscultation       Cardiovascular hypertension, Pt. on medications Normal cardiovascular exam Rhythm:Regular Rate:Normal     Neuro/Psych negative neurological ROS  negative psych ROS   GI/Hepatic Neg liver ROS, GERD  Medicated and Controlled,  Endo/Other  negative endocrine ROS  Renal/GU negative Renal ROS     Musculoskeletal   Abdominal (+) + obese,   Peds  Hematology  (+) anemia ,   Anesthesia Other Findings   Reproductive/Obstetrics                            Anesthesia Physical Anesthesia Plan  ASA: II  Anesthesia Plan: Spinal   Post-op Pain Management:  Regional for Post-op pain   Induction:   PONV Risk Score and Plan: Ondansetron and Dexamethasone  Airway Management Planned: Natural Airway and Mask  Additional Equipment: None  Intra-op Plan:   Post-operative Plan:   Informed Consent: I have reviewed the patients History and Physical, chart, labs and discussed the procedure including the risks, benefits and alternatives for the proposed anesthesia with the patient or authorized representative who has indicated his/her understanding and acceptance.       Plan Discussed with: CRNA  Anesthesia Plan Comments:         Anesthesia Quick Evaluation

## 2019-09-01 NOTE — Progress Notes (Signed)
Assisted Dr. Leilani Able  with  Right knee adductor canal  block. Side rails up, monitors on throughout procedure. See vital signs in flow sheet. Tolerated Procedure well.

## 2019-09-01 NOTE — Anesthesia Procedure Notes (Signed)
Procedure Name: MAC Date/Time: 09/01/2019 10:01 AM Performed by: Niel Hummer, CRNA Pre-anesthesia Checklist: Emergency Drugs available, Patient identified, Patient being monitored and Suction available Oxygen Delivery Method: Simple face mask

## 2019-09-01 NOTE — Anesthesia Procedure Notes (Signed)
Spinal  Patient location during procedure: OR Start time: 09/01/2019 10:04 AM End time: 09/01/2019 10:08 AM Staffing Performed: resident/CRNA  Resident/CRNA: Nelle Don, CRNA Preanesthetic Checklist Completed: patient identified, IV checked, risks and benefits discussed, surgical consent, monitors and equipment checked and pre-op evaluation Spinal Block Patient position: sitting Prep: DuraPrep Patient monitoring: heart rate, continuous pulse ox and blood pressure Approach: midline Location: L3-4 Injection technique: single-shot Needle Needle type: Pencan  Needle gauge: 24 G Needle length: 9 cm

## 2019-09-01 NOTE — Transfer of Care (Signed)
Immediate Anesthesia Transfer of Care Note  Patient: Carol [name]  Procedure(s) Performed: RIGHT TOTAL KNEE ARTHROPLASTY (Right Knee)  Patient Location: PACU  Anesthesia Type:Spinal  Level of Consciousness: awake and alert   Airway & Oxygen Therapy: Patient Spontanous Breathing and Patient connected to face mask oxygen  Post-op Assessment: Report given to RN and Post -op Vital signs reviewed and stable  Post vital signs: Reviewed and stable  Last Vitals:  Vitals Value Taken Time  BP 111/70 09/01/19 1225  Temp    Pulse 86 09/01/19 1227  Resp 17 09/01/19 1227  SpO2 100 % 09/01/19 1227  Vitals shown include unvalidated device data.  Last Pain:  Vitals:   09/01/19 0820  TempSrc: Oral  PainSc:       Patients Stated Pain Goal: 3 (09/01/19 0752)  Complications: No apparent anesthesia complications

## 2019-09-01 NOTE — Evaluation (Signed)
Physical Therapy Evaluation Patient Details Name: Carol [name] MRN: [id number] DOB: 17-Sep-1955 Today's Date: 09/01/2019   History of Present Illness  Patient is 64 y.o. 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    Clinical Impression  LENZI MARMO is a 64 y.o. female POD 0 s/p Rt TKA. Patient reports independence with mobility at baseline. Patient is now limited by functional impairments (see PT problem list below) and requires min assist for transfers and gait with RW. Patient was able to ambulate ~30 feet with RW and min assist and manual facilitation at Rt knee to prevent buckling. Patient instructed in exercise to facilitate ROM and circulation. Patient will benefit from continued skilled PT interventions to address impairments and progress towards PLOF. Acute PT will follow to progress mobility and stair training in preparation for safe discharge home.     Follow Up Recommendations Follow surgeon's recommendation for DC plan and follow-up therapies;Home health PT    Equipment Recommendations  3in1 (PT)(pt is trying to confirm with her husband if she has BSC)    Recommendations for Other Services       Precautions / Restrictions Precautions Precautions: Fall Restrictions Weight Bearing Restrictions: No Other Position/Activity Restrictions: WBAT      Mobility  Bed Mobility Overal bed mobility: Needs Assistance Bed Mobility: Supine to Sit     Supine to sit: Min assist;HOB elevated     General bed mobility comments: cues for use of bed rail and assist to raise trunk and bring LE's off EOB.  Transfers Overall transfer level: Needs assistance Equipment used: Rolling walker (2 wheeled) Transfers: Sit to/from Stand Sit to Stand: Min assist;From elevated surface         General transfer comment: cues for technique with RW, assist required to complete power up and steady with  rise.  Ambulation/Gait Ambulation/Gait assistance: Min assist;Mod assist Gait Distance (Feet): 30 Feet Assistive device: Rolling walker (2 wheeled) Gait Pattern/deviations: Step-to pattern;Decreased stride length;Decreased stance time - right;Decreased step length - left;Decreased weight shift to right Gait velocity: decreased   General Gait Details: VC's for safe step pattern and proximity to RW. Assist required for walker management/positioning and manual facilitation at Rt knee for extension in stance phase.   Stairs     Wheelchair Mobility    Modified Rankin (Stroke Patients Only)       Balance Overall balance assessment: Needs assistance Sitting-balance support: Feet supported Sitting balance-Leahy Scale: Good     Standing balance support: During functional activity;Bilateral upper extremity supported Standing balance-Leahy Scale: Poor            Pertinent Vitals/Pain Pain Assessment: Faces Faces Pain Scale: Hurts little more Pain Location: Rt knee Pain Descriptors / Indicators: Aching;Discomfort Pain Intervention(s): Limited activity within patient's tolerance;Monitored during session;Repositioned;Ice applied    Home Living Family/patient expects to be discharged to:: Private residence Living Arrangements: Spouse/significant other;Children Available Help at Discharge: Family Type of Home: House Home Access: Stairs to enter Entrance Stairs-Rails: None Entrance Stairs-Number of Steps: 1+1 Home Layout: One level Home Equipment: Cane - single point;Walker - 2 wheels(not sure of BSC)      Prior Function Level of Independence: Independent         Comments: works as Comptroller at [employer]   Dominant Hand: Right    Extremity/Trunk Assessment   Upper Extremity Assessment Upper Extremity Assessment:  Overall WFL for tasks assessed    Lower Extremity Assessment Lower Extremity Assessment: RLE deficits/detail RLE  Deficits / Details: pt with sligth extensor lag with SLR, able to prevent buckling in standing with UE use on RW. RLE Sensation: WNL RLE Coordination: WNL    Cervical / Trunk Assessment Cervical / Trunk Assessment: Normal  Communication   Communication: No difficulties  Cognition Arousal/Alertness: Awake/alert Behavior During Therapy: WFL for tasks assessed/performed Overall Cognitive Status: Within Functional Limits for tasks assessed              General Comments      Exercises Total Joint Exercises Ankle Circles/Pumps: AROM;Both;20 reps;Seated Quad Sets: AROM;Right;5 reps;Seated Heel Slides: AROM;Right;5 reps;Seated   Assessment/Plan    PT Assessment Patient needs continued PT services  PT Problem List Decreased strength;Decreased range of motion;Decreased activity tolerance;Decreased balance;Decreased mobility;Decreased knowledge of use of DME;Decreased knowledge of precautions       PT Treatment Interventions DME instruction;Gait training;Stair training;Functional mobility training;Therapeutic activities;Therapeutic exercise;Balance training;Patient/family education    PT Goals (Current goals can be found in the Care Plan section)  Acute Rehab PT Goals Patient Stated Goal: get independent again PT Goal Formulation: With patient Time For Goal Achievement: 09/08/19 Potential to Achieve Goals: Good    Frequency 7X/week    AM-PAC PT "6 Clicks" Mobility  Outcome Measure Help needed turning from your back to your side while in a flat bed without using bedrails?: A Little Help needed moving from lying on your back to sitting on the side of a flat bed without using bedrails?: A Little Help needed moving to and from a bed to a chair (including a wheelchair)?: A Little Help needed standing up from a chair using your arms (e.g., wheelchair or bedside chair)?: A Little Help needed to walk in hospital room?: A Lot Help needed climbing 3-5 steps with a railing? : A Lot 6  Click Score: 16    End of Session Equipment Utilized During Treatment: Gait belt Activity Tolerance: Patient tolerated treatment well Patient left: in chair;with call bell/phone within reach;with chair alarm set Nurse Communication: Mobility status PT Visit Diagnosis: Muscle weakness (generalized) (M62.81);Difficulty in walking, not elsewhere classified (R26.2)    Time: 5397-6734 PT Time Calculation (min) (ACUTE ONLY): 26 min   Charges:   PT Evaluation $PT Eval Low Complexity: 1 Low PT Treatments $Gait Training: 8-22 mins       Verner Mould, DPT Physical Therapist with [facility] [phone]  09/01/2019 6:45 PM

## 2019-09-01 NOTE — Interval H&P Note (Signed)
History and Physical Interval Note:  09/01/2019 9:42 AM  Carol [name]  has presented today for surgery, with the diagnosis of RIGHT KNEE OSTEOARTHRITIS.  The various methods of treatment have been discussed with the patient and family. After consideration of risks, benefits and other options for treatment, the patient has consented to  Procedure(s): RIGHT TOTAL KNEE ARTHROPLASTY (Right) as a surgical intervention.  The patient's history has been reviewed, patient examined, no change in status, stable for surgery.  I have reviewed the patient's chart and labs.  Questions were answered to the patient's satisfaction.     Nestor Lewandowsky

## 2019-09-01 NOTE — Anesthesia Procedure Notes (Addendum)
Anesthesia Regional Block: Adductor canal block   Pre-Anesthetic Checklist: ,, timeout performed, Correct Patient, Correct Site, Correct Laterality, Correct Procedure, Correct Position, site marked, Risks and benefits discussed,  Surgical consent,  Pre-op evaluation,  At surgeon's request and post-op pain management  Laterality: Lower and Right  Prep: chloraprep       Needles:  Injection technique: Single-shot  Needle Type: Echogenic Stimulator Needle     Needle Length: 9cm  Needle Gauge: 20   Needle insertion depth: 3 cm   Additional Needles:   Procedures:,,,, ultrasound used (permanent image in chart),,,,  Narrative:  Start time: 09/01/2019 9:45 AM End time: 09/01/2019 9:55 AM Injection made incrementally with aspirations every 5 mL.  Performed by: Personally  Anesthesiologist: Leilani Able, MD

## 2019-09-01 NOTE — Op Note (Addendum)
PATIENT ID:      Carol [name]  MRN:     [id number] DOB/AGE:    08-31-1955 / 64 y.o.       OPERATIVE REPORT   DATE OF PROCEDURE:  09/01/2019      PREOPERATIVE DIAGNOSIS:   RIGHT KNEE OSTEOARTHRITIS      Estimated body mass index is 35.6 kg/m as calculated from the following:   Height as of 08/20/19: 5\' 6"  (1.676 m).   Weight as of 08/20/19: 100 kg.                                                       POSTOPERATIVE DIAGNOSIS:   Same                                                                  PROCEDURE:  Procedure(s): RIGHT TOTAL KNEE ARTHROPLASTY Using DepuyAttune RP implants #6R Femur, #6Tibia, 5 mm Attune RP bearing, 35 Patella    SURGEON: 08/22/19  ASSISTANT:  Nestor Lewandowsky PA-S,   (Present and scrubbed throughout the case, critical for assistance with exposure, retraction, instrumentation, and closure.)        ANESTHESIA: Spinal, 20cc Exparel, 50cc 0.25% Marcaine EBL: 300 cc FLUID REPLACEMENT: 1600 cc crystaloid TOURNIQUET: DRAINS: None TRANEXAMIC ACID: 1gm IV, 2gm topical COMPLICATIONS:  None         INDICATIONS FOR PROCEDURE: The patient has  RIGHT KNEE OSTEOARTHRITIS, Var deformities, XR shows bone on bone arthritis, lateral subluxation of tibia. Patient has failed all conservative measures including anti-inflammatory medicines, narcotics, attempts at exercise and weight loss, cortisone injections and viscosupplementation.  Risks and benefits of surgery have been discussed, questions answered.   DESCRIPTION OF PROCEDURE: The patient identified by armband, received  IV antibiotics, in the holding area at [facility]. Patient taken to the operating room, appropriate anesthetic monitors were attached, and Spinal anesthesia was  induced. IV Tranexamic acid was given.Tourniquet applied high to the operative thigh. Lateral post and foot positioner applied to the table, the lower extremity was then prepped and draped in usual sterile fashion from the toes to the  tourniquet. Time-out procedure was performed. [facility]. [facility] PAC, was present and scrubbed throughout the case, critical for assistance with, positioning, exposure, retraction, instrumentation, and closure.The skin and subcutaneous tissue along the incision was injected with 20 cc of a mixture of Exparel and Marcaine solution, using a 20-gauge by 1-1/2 inch needle. We began the operation, with the knee flexed 130 degrees, by making the anterior midline incision starting at handbreadth above the patella going over the patella 1 cm medial to and 4 cm distal to the tibial tubercle. Small bleeders in the skin and the subcutaneous tissue identified and cauterized. Transverse retinaculum was incised and reflected medially and a medial parapatellar arthrotomy was accomplished. the patella was everted and theprepatellar fat pad resected. The superficial medial collateral ligament was then elevated from anterior to posterior along the proximal flare of the tibia and anterior half of the menisci resected. The knee was hyperflexed exposing bone on bone arthritis. Peripheral and notch osteophytes as  well as the cruciate ligaments were then resected. We continued to work our way around posteriorly along the proximal tibia, and externally rotated the tibia subluxing it out from underneath the femur. A McHale PCL retractor was placed through the notch and a lateral Hohmann retractor placed, and we then entered the proximal tibia in line with the Depuy starter drill in line with the axis of the tibia followed by an intramedullary guide rod and 0-degree posterior slope cutting guide. The tibial cutting guide, 4 degree posterior sloped, was pinned into place allowing resection of 2 mm of bone medially and 11 mm of bone laterally. Satisfied with the tibial resection, we then entered the distal femur 2 mm anterior to the PCL origin with the intramedullary guide rod and applied the distal femoral cutting guide set at 9 mm, with 5  degrees of valgus. This was pinned along the epicondylar axis. At this point, the distal femoral cut was accomplished without difficulty. We then sized for a #6R femoral component and pinned the guide in 3 degrees of external rotation. The chamfer cutting guide was pinned into place. The anterior, posterior, and chamfer cuts were accomplished without difficulty followed by the Attune RP box cutting guide and the box cut. We also removed posterior osteophytes from the posterior femoral condyles. The posterior capsule was injected with Exparel solution. The knee was brought into full extension. We checked our extension gap and fit a 5 mm bearing. Distracting in extension with a lamina spreader,  bleeders in the posterior capsule, Posterior medial and posterior lateral gutter were cauterized.  The transexamic acid-soaked sponge was then placed in the gap of the knee in extension. The knee was flexed 30. The posterior patella cut was accomplished with the 9.5 mm Attune cutting guide, sized for a 40mm dome, and the fixation pegs drilled.The knee was then once again hyperflexed exposing the proximal tibia. We sized for a # 6 tibial base plate, applied the smokestack and the conical reamer followed by the the Delta fin keel punch. We then hammered into place the Attune RP trial femoral component, drilled the lugs, inserted a  5 mm trial bearing, trial patellar button, and took the knee through range of motion from 0-130 degrees. Medial and lateral ligamentous stability was checked. No thumb pressure was required for patellar Tracking. The tourniquet was not used. All trial components were removed, mating surfaces irrigated with pulse lavage, and dried with suction and sponges. 10 cc of the Exparel solution was applied to the cancellus bone of the patella distal femur and proximal tibia.  After waiting 30 seconds, the bony surfaces were again, dried with sponges. A double batch of DePuy HV cement was mixed and applied to  all bony metallic mating surfaces except for the posterior condyles of the femur itself. In order, we hammered into place the tibial tray and removed excess cement, the femoral component and removed excess cement. The final Attune RP bearing was inserted, and the knee brought to full extension with compression. The patellar button was clamped into place, and excess cement removed. The knee was held at 30 flexion with compression, while the cement cured. The wound was irrigated out with normal saline solution pulse lavage. The rest of the Exparel was injected into the parapatellar arthrotomy, subcutaneous tissues, and periosteal tissues. The parapatellar arthrotomy was closed with running #1 Vicryl suture. The subcutaneous tissue with 3-0 undyed Vicryl suture, and the skin with running 3-0 SQ vicryl. An Aquacil and Ace wrap were applied.  The patient was taken to recovery room without difficulty.   Kerin Salen 09/01/2019, 9:43 AM  PATIENT ID:      Carol [name]  MRN:     [id number] DOB/AGE:    [birth year] / 64 y.o.

## 2019-09-01 NOTE — Progress Notes (Signed)
Orthopedic Tech Progress Note Patient Details:  Carol [name] [birth year] [id number]  Ortho Devices Type of Ortho Device: Bone foam zero knee Ortho Device/Splint Interventions: Application   Post Interventions Patient Tolerated: Well Instructions Provided: Care of device   Saul Fordyce 09/01/2019, 1:19 PM

## 2019-09-01 NOTE — Anesthesia Postprocedure Evaluation (Signed)
Anesthesia Post Note  Patient: Jonny Ruiz  Procedure(s) Performed: RIGHT TOTAL KNEE ARTHROPLASTY (Right Knee)     Patient location during evaluation: PACU Anesthesia Type: Spinal Level of consciousness: awake Pain management: pain level controlled Vital Signs Assessment: post-procedure vital signs reviewed and stable Respiratory status: spontaneous breathing Cardiovascular status: stable Postop Assessment: no headache, no backache, spinal receding, patient able to bend at knees and no apparent nausea or vomiting Anesthetic complications: no    Last Vitals:  Vitals:   09/01/19 1245 09/01/19 1300  BP: 128/79 109/86  Pulse: 66 (!) 56  Resp: 13 (!) 22  Temp:    SpO2: 100% 100%    Last Pain:  Vitals:   09/01/19 0820  TempSrc: Oral  PainSc:    Pain Goal: Patients Stated Pain Goal: 3 (09/01/19 0752)                 Caren Macadam

## 2019-09-02 ENCOUNTER — Encounter: Payer: Self-pay | Admitting: *Deleted

## 2019-09-02 DIAGNOSIS — M1711 Unilateral primary osteoarthritis, right knee: Secondary | ICD-10-CM | POA: Diagnosis not present

## 2019-09-02 LAB — BASIC METABOLIC PANEL
Anion gap: 7 (ref 5–15)
BUN: 15 mg/dL (ref 8–23)
CO2: 25 mmol/L (ref 22–32)
Calcium: 8.4 mg/dL — ABNORMAL LOW (ref 8.9–10.3)
Chloride: 107 mmol/L (ref 98–111)
Creatinine, Ser: 0.9 mg/dL (ref 0.44–1.00)
GFR calc Af Amer: >60 mL/min (ref >60)
GFR calc non Af Amer: >60 mL/min (ref >60)
Glucose, Bld: 173 mg/dL — ABNORMAL HIGH (ref 70–99)
Potassium: 3.6 mmol/L (ref 3.5–5.1)
Sodium: 139 mmol/L (ref 135–145)

## 2019-09-02 LAB — CBC
HCT: 33 % — ABNORMAL LOW (ref 36.0–46.0)
Hemoglobin: 10.7 g/dL — ABNORMAL LOW (ref 12.0–15.0)
MCH: 31.5 pg (ref 26.0–34.0)
MCHC: 32.4 g/dL (ref 30.0–36.0)
MCV: 97.1 fL (ref 80.0–100.0)
Platelets: 200 10*3/uL (ref 150–400)
RBC: 3.4 MIL/uL — ABNORMAL LOW (ref 3.87–5.11)
RDW: 11.9 % (ref 11.5–15.5)
WBC: 9.9 10*3/uL (ref 4.0–10.5)
nRBC: 0 % (ref 0.0–0.2)

## 2019-09-02 MED ORDER — OXYCODONE HCL 5 MG PO TABS: 5.0000 mg | ORAL_TABLET | ORAL | 0 refills | Status: AC | PRN

## 2019-09-02 MED ORDER — TIZANIDINE HCL 2 MG PO CAPS: 4.0000 mg | ORAL_CAPSULE | Freq: Three times a day (TID) | ORAL | 0 refills | Status: AC | PRN

## 2019-09-02 MED ORDER — ASPIRIN EC 81 MG PO TBEC: 81.0000 mg | DELAYED_RELEASE_TABLET | Freq: Every day | ORAL | 2 refills | Status: AC

## 2019-09-02 NOTE — Plan of Care (Signed)
°  Problem: Education: Goal: Knowledge of General Education information will improve Description: Including pain rating scale, medication(s)/side effects and non-pharmacologic comfort measures 09/02/2019 1623 by Iantha Fallen, RN Outcome: Adequate for Discharge 09/02/2019 1149 by Iantha Fallen, RN Outcome: Progressing   Problem: Health Behavior/Discharge Planning: Goal: Ability to manage health-related needs will improve 09/02/2019 1623 by Iantha Fallen, RN Outcome: Adequate for Discharge 09/02/2019 1149 by Iantha Fallen, RN Outcome: Progressing   Problem: Clinical Measurements: Goal: Ability to maintain clinical measurements within normal limits will improve 09/02/2019 1623 by Iantha Fallen, RN Outcome: Adequate for Discharge 09/02/2019 1149 by Iantha Fallen, RN Outcome: Progressing Goal: Will remain free from infection 09/02/2019 1623 by Iantha Fallen, RN Outcome: Adequate for Discharge 09/02/2019 1149 by Iantha Fallen, RN Outcome: Progressing Goal: Diagnostic test results will improve Outcome: Adequate for Discharge Goal: Respiratory complications will improve Outcome: Adequate for Discharge Goal: Cardiovascular complication will be avoided Outcome: Adequate for Discharge   Problem: Activity: Goal: Risk for activity intolerance will decrease Outcome: Adequate for Discharge   Problem: Elimination: Goal: Will not experience complications related to bowel motility Outcome: Adequate for Discharge   Problem: Pain Managment: Goal: General experience of comfort will improve Outcome: Adequate for Discharge   Problem: Safety: Goal: Ability to remain free from injury will improve Outcome: Adequate for Discharge   Problem: Skin Integrity: Goal: Risk for impaired skin integrity will decrease Outcome: Adequate for Discharge   Problem: Education: Goal: Knowledge of the prescribed therapeutic regimen will improve Outcome: Adequate for Discharge   Problem: Activity: Goal:  Ability to avoid complications of mobility impairment will improve Outcome: Adequate for Discharge Goal: Range of joint motion will improve Outcome: Adequate for Discharge   Problem: Clinical Measurements: Goal: Postoperative complications will be avoided or minimized Outcome: Adequate for Discharge   Problem: Pain Management: Goal: Pain level will decrease with appropriate interventions Outcome: Adequate for Discharge   Problem: Skin Integrity: Goal: Will show signs of wound healing Outcome: Adequate for Discharge

## 2019-09-02 NOTE — Progress Notes (Signed)
PATIENT ID: Carol [name]  MRN: [id number]  DOB/AGE:  1955/12/27 / 64 y.o.  1 Day Post-Op Procedure(s) (LRB): RIGHT TOTAL KNEE ARTHROPLASTY (Right)    PROGRESS NOTE Subjective: Patient is alert, oriented, no Nausea, no Vomiting, yes passing gas. Taking PO well. Denies SOB, Chest or Calf Pain. Using Incentive Spirometer, PAS in place. Ambulate 30', Patient reports pain as 2/10 .    Objective: Vital signs in last 24 hours: Vitals:   09/01/19 1820 09/01/19 2042 09/02/19 0104 09/02/19 0449  BP: 115/76 129/73 (Abnormal) 141/81 124/77  Pulse: 71 67 (Abnormal) 59 (Abnormal) 58  Resp: 16 16 16 16   Temp: 97.8 F (36.6 C) 98 F (36.7 C) 97.6 F (36.4 C) (Abnormal) 97.5 F (36.4 C)  TempSrc: Oral Oral Oral Oral  SpO2: 97% 99% 100% 100%  Weight:      Height:          Intake/Output from previous day: I/O last 3 completed shifts: In: 3472.5 [P.O.:660; I.V.:2512.5; IV Piggyback:300] Out: 1200 [Urine:1100; Blood:100]   Intake/Output this shift: No intake/output data recorded.   LABORATORY DATA: Recent Labs    09/02/19 0431  WBC 9.9  HGB 10.7*  HCT 33.0*  PLT 200  NA 139  K 3.6  CL 107  CO2 25  BUN 15  CREATININE 0.90  GLUCOSE 173*  CALCIUM 8.4*    Examination: Neurologically intact ABD soft Neurovascular intact Sensation intact distally Intact pulses distally Dorsiflexion/Plantar flexion intact Incision: dressing C/D/I No cellulitis present Compartment soft} Can easily do straight leg raise, range of motion 0/70  Assessment:   1 Day Post-Op Procedure(s) (LRB): RIGHT TOTAL KNEE ARTHROPLASTY (Right) ADDITIONAL DIAGNOSIS: Expected Acute Blood Loss Anemia,   Patient's anticipated LOS is less than 2 midnights, meeting these requirements: - Younger than 87 - Lives within 1 hour of care - Has a competent adult at home to recover with post-op recover - NO history of  - Chronic pain requiring opiods  - Diabetes  - Coronary Artery Disease  - Heart failure  -  Heart attack  - Stroke  - DVT/VTE  - Cardiac arrhythmia  - Respiratory Failure/COPD  - Renal failure  - Anemia  - Advanced Liver disease       Plan: PT/OT WBAT, AROM and PROM  DVT Prophylaxis:  SCDx72hrs, ASA 81 mg BID x 2 weeks DISCHARGE PLAN: Home, Today when she passes physical therapy DISCHARGE NEEDS: HHPT, Walker and 3-in-1 comode seat     76 09/02/2019, 8:12 AM Patient ID: 11/02/2019, female   DOB: 01/20/1956, 64 y.o.   MRN: [id number]

## 2019-09-02 NOTE — Discharge Instructions (Signed)

## 2019-09-02 NOTE — Progress Notes (Signed)
Physical Therapy Treatment Patient Details Name: Carol [name] MRN: [id number] DOB: 10/12/1955 Today's Date: 09/02/2019    History of Present Illness Patient is 64 y.o. 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    PT Comments    Progressing well with mobility. All education completed. Okay to d/c from PT standpoint.    Follow Up Recommendations  Follow surgeon's recommendation for DC plan and follow-up therapies     Equipment Recommendations  3in1 (PT)    Recommendations for Other Services       Precautions / Restrictions Precautions Precautions: Fall Restrictions Weight Bearing Restrictions: No Other Position/Activity Restrictions: WBAT    Mobility  Bed Mobility               General bed mobility comments: oob in recliner  Transfers Overall transfer level: Needs assistance Equipment used: Rolling walker (2 wheeled) Transfers: Sit to/from Stand Sit to Stand: Supervision            Ambulation/Gait Ambulation/Gait assistance: Supervision Gait Distance (Feet): 125 Feet Assistive device: Rolling walker (2 wheeled) Gait Pattern/deviations: Step-through pattern         Stairs Stairs: Yes Stairs assistance: Supervision Stair Management: Step to pattern;Forwards;With walker Number of Stairs: 1 General stair comments: VCs safety, technique, sequence. Supv for safety   Wheelchair Mobility    Modified Rankin (Stroke Patients Only)       Balance Overall balance assessment: Needs assistance         Standing balance support: Bilateral upper extremity supported Standing balance-Leahy Scale: Fair                              Cognition Arousal/Alertness: Awake/alert Behavior During Therapy: WFL for tasks assessed/performed Overall Cognitive Status: Within Functional Limits for tasks assessed                                        Exercises Total Joint Exercises Ankle Circles/Pumps: AROM;Both;10 reps Quad Sets: AROM;Both;10 reps Heel Slides: AAROM;Right;10 reps Hip ABduction/ADduction: AROM;Right;10 reps Straight Leg Raises: AROM;Right;10 reps Goniometric ROM: ~10-75 degrees    General Comments        Pertinent Vitals/Pain Pain Assessment: 0-10 Pain Score: 3  Pain Location: R knee Pain Descriptors / Indicators: Discomfort;Sore Pain Intervention(s): Monitored during session;Limited activity within patient's tolerance;Repositioned    Home Living                      Prior Function            PT Goals (current goals can now be found in the care plan section) Progress towards PT goals: Progressing toward goals    Frequency    7X/week      PT Plan Current plan remains appropriate    Co-evaluation              AM-PAC PT "6 Clicks" Mobility   Outcome Measure  Help needed turning from your back to your side while in a flat bed without using bedrails?: A Little Help needed moving from lying on your back to sitting on the side of a flat bed without using bedrails?: A Little Help needed moving to and from a bed to a chair (including a wheelchair)?: A Little Help  needed standing up from a chair using your arms (e.g., wheelchair or bedside chair)?: A Little Help needed to walk in hospital room?: A Little Help needed climbing 3-5 steps with a railing? : A Little 6 Click Score: 18    End of Session Equipment Utilized During Treatment: Gait belt Activity Tolerance: Patient tolerated treatment well Patient left: in chair;with call bell/phone within reach   PT Visit Diagnosis: Other abnormalities of gait and mobility (R26.89)     Time: 1429-1450 PT Time Calculation (min) (ACUTE ONLY): 21 min  Charges:  $Gait Training: 8-22 mins $Therapeutic Exercise: 8-22 mins                         Faye Ramsay, PT Acute Rehabilitation  Office: [phone] Pager: [phone]

## 2019-09-02 NOTE — TOC Transition Note (Signed)
Transition of Care (TOC) - CM/SW Discharge Note   Patient Details  Name: Henrietta H Yeley MRN: [id number] Date of Birth: 09/26/1955  Transition of Care (TOC) CM/SW Contact:  HOYLE, LUCY, LCSW Phone Number: 09/02/2019, 10:27 AM   Clinical Narrative:   Met briefly to confirm with pt that she has HHPT set up with [facility] and [facility] via [facility].  No TOC needs.    Final next level of care: Home w Home Health Services Barriers to Discharge: No Barriers Identified   Patient Goals and CMS Choice        Discharge Placement                       Discharge Plan and Services                DME Arranged: 3-N-1 DME Agency: [employer]       HH Arranged: PT HH Agency: [facility] (now [facility])        Social Determinants of Health (SDOH) Interventions     Readmission Risk Interventions No flowsheet data found.     

## 2019-09-02 NOTE — Plan of Care (Signed)
Plan of care reviewed and discussed with the patient. 

## 2019-09-02 NOTE — Progress Notes (Signed)
Physical Therapy Treatment Patient Details Name: Carol [name] MRN: [id number] DOB: January 24, 1956 Today's Date: 09/02/2019    History of Present Illness Patient is 64 y.o. 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    PT Comments    Progressing very well with mobility. Will plan to have a 2nd session prior to d/c home later today.    Follow Up Recommendations  Follow surgeon's recommendation for DC plan and follow-up therapies     Equipment Recommendations  3in1 (PT)    Recommendations for Other Services       Precautions / Restrictions Precautions Precautions: Fall Restrictions Weight Bearing Restrictions: No Other Position/Activity Restrictions: WBAT    Mobility  Bed Mobility               General bed mobility comments: oob in recliner  Transfers Overall transfer level: Needs assistance Equipment used: Rolling walker (2 wheeled) Transfers: Sit to/from Stand Sit to Stand: Min guard            Ambulation/Gait Ambulation/Gait assistance: Min guard Gait Distance (Feet): 100 Feet Assistive device: Rolling walker (2 wheeled) Gait Pattern/deviations: Step-to pattern;Step-through pattern;Decreased stride length         Stairs             Wheelchair Mobility    Modified Rankin (Stroke Patients Only)       Balance Overall balance assessment: Needs assistance         Standing balance support: Bilateral upper extremity supported Standing balance-Leahy Scale: Fair                              Cognition Arousal/Alertness: Awake/alert Behavior During Therapy: WFL for tasks assessed/performed Overall Cognitive Status: Within Functional Limits for tasks assessed                                        Exercises Total Joint Exercises Ankle Circles/Pumps: AROM;Both;10 reps Quad Sets: AROM;Both;10 reps Heel Slides: AAROM;Right;10  reps Hip ABduction/ADduction: AROM;Right;10 reps Straight Leg Raises: AROM;Right;10 reps Goniometric ROM: ~10-75 degrees    General Comments        Pertinent Vitals/Pain Pain Assessment: 0-10 Pain Score: 3  Pain Location: R knee Pain Descriptors / Indicators: Discomfort;Sore Pain Intervention(s): Ice applied;Monitored during session    Home Living                      Prior Function            PT Goals (current goals can now be found in the care plan section) Progress towards PT goals: Progressing toward goals    Frequency    7X/week      PT Plan Current plan remains appropriate    Co-evaluation              AM-PAC PT "6 Clicks" Mobility   Outcome Measure  Help needed turning from your back to your side while in a flat bed without using bedrails?: A Little Help needed moving from lying on your back to sitting on the side of a flat bed without using bedrails?: A Little Help needed moving to and from a bed to a chair (including a wheelchair)?: A Little Help needed standing up from a chair using  your arms (e.g., wheelchair or bedside chair)?: A Little Help needed to walk in hospital room?: A Little Help needed climbing 3-5 steps with a railing? : A Little 6 Click Score: 18    End of Session Equipment Utilized During Treatment: Gait belt Activity Tolerance: Patient tolerated treatment well Patient left: in chair;with call bell/phone within reach   PT Visit Diagnosis: Other abnormalities of gait and mobility (R26.89)     Time: 3244-0102 PT Time Calculation (min) (ACUTE ONLY): 28 min  Charges:  $Gait Training: 8-22 mins $Therapeutic Exercise: 8-22 mins                         Faye Ramsay, PT Acute Rehabilitation  Office: [phone] Pager: [phone]

## 2019-09-02 NOTE — Discharge Summary (Signed)
Patient ID: Carol [name] MRN: [id number] DOB/AGE: 12-03-1955 64 y.o.  Admit date: 09/01/2019 Discharge date: 09/02/2019  Admission Diagnoses:  Principal Problem:   Osteoarthritis of right knee Active Problems:   Total knee replacement status, right   Discharge Diagnoses:  Same  Past Medical History:  Diagnosis Date  . Anemia   . Arthritis   . Endometriosis   . GERD (gastroesophageal reflux disease)   . Hypertension   . MVP (mitral valve prolapse)   . PONV (postoperative nausea and vomiting)     Surgeries: Procedure(s): RIGHT TOTAL KNEE ARTHROPLASTY on 09/01/2019   Consultants:   Discharged Condition: Improved  Hospital Course: Carol [name] is an 64 y.o. female who was admitted 09/01/2019 for operative treatment ofOsteoarthritis of right knee. Patient has severe unremitting pain that affects sleep, daily activities, and work/hobbies. After pre-op clearance the patient was taken to the operating room on 09/01/2019 and underwent  Procedure(s): RIGHT TOTAL KNEE ARTHROPLASTY.    Patient was given perioperative antibiotics:  Anti-infectives (From admission, onward)   Start     Dose/Rate Route Frequency Ordered Stop   09/01/19 0715  ceFAZolin (ANCEF) IVPB 2g/100 mL premix     2 g 200 mL/hr over 30 Minutes Intravenous On call to O.R. 09/01/19 4235 09/01/19 1038       Patient was given sequential compression devices, early ambulation, and chemoprophylaxis to prevent DVT.  Patient benefited maximally from hospital stay and there were no complications.    Recent vital signs:  Patient Vitals for the past 24 hrs:  BP Temp Temp src Pulse Resp SpO2 Height Weight  09/02/19 0449 124/77 (Abnormal) 97.5 F (36.4 C) Oral (Abnormal) 58 16 100 % no documentation no documentation  09/02/19 0104 (Abnormal) 141/81 97.6 F (36.4 C) Oral (Abnormal) 59 16 100 % no documentation no documentation  09/01/19 2042 129/73 98 F (36.7 C) Oral 67 16 99 % no documentation no documentation   09/01/19 1820 115/76 97.8 F (36.6 C) Oral 71 16 97 % no documentation no documentation  09/01/19 1704 128/84 98.4 F (36.9 C) no documentation 67 18 100 % no documentation no documentation  09/01/19 1615 123/80 no documentation no documentation 82 18 98 % no documentation no documentation  09/01/19 1510 no documentation no documentation no documentation no documentation no documentation no documentation 5\' 6"  (1.676 m) 99.8 kg  09/01/19 1507 107/75 97.6 F (36.4 C) Oral (Abnormal) 58 16 100 % no documentation no documentation  09/01/19 1430 109/68 no documentation no documentation (Abnormal) 56 12 100 % no documentation no documentation  09/01/19 1415 117/75 no documentation no documentation (Abnormal) 54 11 100 % no documentation no documentation  09/01/19 1400 119/74 no documentation no documentation 60 12 100 % no documentation no documentation  09/01/19 1345 116/78 no documentation no documentation (Abnormal) 54 11 100 % no documentation no documentation  09/01/19 1330 118/74 no documentation no documentation (Abnormal) 56 13 100 % no documentation no documentation  09/01/19 1315 119/74 no documentation no documentation (Abnormal) 54 14 100 % no documentation no documentation  09/01/19 1300 109/86 no documentation no documentation (Abnormal) 56 (Abnormal) 22 100 % no documentation no documentation  09/01/19 1245 128/79 no documentation no documentation 66 13 100 % no documentation no documentation  09/01/19 1230 118/72 no documentation no documentation 79 16 100 % no documentation no documentation  09/01/19 1225 111/70 98.4 F (36.9 C) no documentation 90 18 100 % no documentation no documentation  09/01/19 0954 123/73 no documentation no documentation 66 12  100 % no documentation no documentation  09/01/19 0949 125/76 no documentation no documentation 68 12 100 % no documentation no documentation  09/01/19 0944 139/74 no documentation no documentation 71 16 100 % no documentation no  documentation     Recent laboratory studies:  Recent Labs    09/02/19 0431  WBC 9.9  HGB 10.7*  HCT 33.0*  PLT 200  NA 139  K 3.6  CL 107  CO2 25  BUN 15  CREATININE 0.90  GLUCOSE 173*  CALCIUM 8.4*     Discharge Medications:   Allergies as of 09/02/2019   No Known Allergies     Medication List    Stop taking these medications   HYDROcodone-acetaminophen 5-325 MG tablet Commonly known as: NORCO/VICODIN   ibuprofen 200 MG tablet Commonly known as: ADVIL     Take these medications   amoxicillin 500 MG capsule Commonly known as: AMOXIL Take 2,000 mg by mouth See admin instructions. Take 2000 mg 1 hour prior to dental work   aspirin EC 81 MG tablet Take 1 tablet (81 mg total) by mouth daily.   atorvastatin 20 MG tablet Commonly known as: LIPITOR Take 20 mg by mouth daily.   BIOFREEZE EX Apply 1 application topically daily as needed (knee pain).   BLINK TEARS OP Place 1 drop into both eyes daily as needed (dry eyes).   cetirizine 10 MG tablet Commonly known as: ZYRTEC Take 10 mg by mouth at bedtime as needed for allergies.   ciclopirox 8 % solution Commonly known as: PENLAC Apply 1 application topically at bedtime as needed (nail fungus). Apply over nail and surrounding skin. Apply daily over previous coat. After seven (7) days, may remove with alcohol and continue cycle.   famotidine 20 MG tablet Commonly known as: PEPCID Take 20 mg by mouth daily as needed for heartburn.   omeprazole 40 MG capsule Commonly known as: PRILOSEC Take 40 mg by mouth daily as needed (acid reflux).   oxyCODONE 5 MG immediate release tablet Commonly known as: Roxicodone Take 1-2 tablets (5-10 mg total) by mouth every 4 (four) hours as needed for up to 7 days for severe pain.   potassium chloride 10 MEQ tablet Commonly known as: KLOR-CON Take 10 mEq by mouth daily.   quinapril-hydrochlorothiazide 10-12.5 MG tablet Commonly known as: ACCURETIC Take 1 tablet by mouth  daily.   tizanidine 2 MG capsule Commonly known as: Zanaflex Take 2 capsules (4 mg total) by mouth 3 (three) times daily as needed for muscle spasms.        Durable Medical Equipment  (From admission, onward)         Start     Ordered   09/01/19 1511  DME Walker rolling  Once    Question:  Patient needs a walker to treat with the following condition  Answer:  Status post right knee replacement   09/01/19 1510   09/01/19 1511  DME 3 n 1  Once     09/01/19 1510           Discharge Care Instructions  (From admission, onward)         Start     Ordered   09/02/19 0000  Change dressing    Comments: Change dressing Only if drainage exceeds 40% of window on dressing   09/02/19 0826          Diagnostic Studies: DG Chest 2 View  Result Date: 08/21/2019 CLINICAL DATA:  Preoperative for right knee surgery, history  of medicated hypertension and mitral valve prolapse EXAM: CHEST - 2 VIEW COMPARISON:  Radiograph 08/20/2012 FINDINGS: No consolidation, features of edema, pneumothorax, or effusion. Pulmonary vascularity is normally distributed. The cardiomediastinal contours are unremarkable. No acute osseous or soft tissue abnormality. IMPRESSION: No acute cardiopulmonary abnormality. Electronically Signed   By: Kreg Shropshire M.D.   On: 08/21/2019 00:28    Disposition: Discharge disposition: 01-Home or Self Care       Discharge Instructions    Call MD / Call 911   Complete by: As directed    If you experience chest pain or shortness of breath, CALL 911 and be transported to the hospital emergency room.  If you develope a fever above 101 F, pus (white drainage) or increased drainage or redness at the wound, or calf pain, call your surgeon's office.   Change dressing   Complete by: As directed    Change dressing Only if drainage exceeds 40% of window on dressing   Constipation Prevention   Complete by: As directed    Drink plenty of fluids.  Prune juice may be helpful.  You  may use a stool softener, such as Colace (over the counter) 100 mg twice a day.  Use MiraLax (over the counter) for constipation as needed.   Diet - low sodium heart healthy   Complete by: As directed    Increase activity slowly as tolerated   Complete by: As directed       Follow-up Information    Gean Birchwood, MD Follow up in 2 week(s).   Specialty: Orthopedic Surgery Contact information: [address] [place] [address] [phone]            Signed: Nestor Lewandowsky 09/02/2019, 8:28 AM

## 2019-09-02 NOTE — Plan of Care (Signed)

## 2019-10-27 ENCOUNTER — Other Ambulatory Visit (HOSPITAL_COMMUNITY): Payer: Self-pay | Admitting: Orthopedic Surgery

## 2019-10-27 DIAGNOSIS — M25561 Pain in right knee: Secondary | ICD-10-CM

## 2019-10-28 ENCOUNTER — Ambulatory Visit (HOSPITAL_COMMUNITY)
Admission: RE | Admit: 2019-10-28 | Discharge: 2019-10-28 | Disposition: A | Discharge status: Home / Self Care | Payer: No Typology Code available for payment source | Source: Ambulatory Visit | Attending: Vascular Surgery | Admitting: Vascular Surgery

## 2019-10-28 ENCOUNTER — Other Ambulatory Visit: Payer: Self-pay

## 2019-10-28 DIAGNOSIS — R7989 Other specified abnormal findings of blood chemistry: Secondary | ICD-10-CM | POA: Diagnosis not present

## 2019-10-28 DIAGNOSIS — M25561 Pain in right knee: Secondary | ICD-10-CM | POA: Insufficient documentation

## 2020-04-22 ENCOUNTER — Other Ambulatory Visit (HOSPITAL_BASED_OUTPATIENT_CLINIC_OR_DEPARTMENT_OTHER): Payer: Self-pay | Admitting: Obstetrics and Gynecology

## 2020-04-22 DIAGNOSIS — Z1231 Encounter for screening mammogram for malignant neoplasm of breast: Secondary | ICD-10-CM

## 2020-04-26 ENCOUNTER — Ambulatory Visit (HOSPITAL_BASED_OUTPATIENT_CLINIC_OR_DEPARTMENT_OTHER): Payer: No Typology Code available for payment source

## 2020-05-03 ENCOUNTER — Other Ambulatory Visit: Payer: Self-pay

## 2020-05-03 ENCOUNTER — Ambulatory Visit (HOSPITAL_BASED_OUTPATIENT_CLINIC_OR_DEPARTMENT_OTHER)
Admission: RE | Admit: 2020-05-03 | Discharge: 2020-05-03 | Disposition: A | Discharge status: Home / Self Care | Payer: No Typology Code available for payment source | Source: Ambulatory Visit | Attending: Obstetrics and Gynecology | Admitting: Obstetrics and Gynecology

## 2020-05-03 ENCOUNTER — Encounter (HOSPITAL_BASED_OUTPATIENT_CLINIC_OR_DEPARTMENT_OTHER): Payer: Self-pay

## 2020-05-03 DIAGNOSIS — Z1231 Encounter for screening mammogram for malignant neoplasm of breast: Secondary | ICD-10-CM | POA: Insufficient documentation

## 2022-06-19 ENCOUNTER — Other Ambulatory Visit: Payer: Self-pay

## 2022-06-19 ENCOUNTER — Emergency Department (HOSPITAL_BASED_OUTPATIENT_CLINIC_OR_DEPARTMENT_OTHER)
Admission: EM | Admit: 2022-06-19 | Discharge: 2022-06-19 | Disposition: A | Discharge status: Home / Self Care | Payer: No Typology Code available for payment source | Attending: Emergency Medicine | Admitting: Emergency Medicine

## 2022-06-19 ENCOUNTER — Emergency Department (HOSPITAL_BASED_OUTPATIENT_CLINIC_OR_DEPARTMENT_OTHER): Payer: No Typology Code available for payment source

## 2022-06-19 ENCOUNTER — Encounter (HOSPITAL_BASED_OUTPATIENT_CLINIC_OR_DEPARTMENT_OTHER): Payer: Self-pay

## 2022-06-19 DIAGNOSIS — I10 Essential (primary) hypertension: Secondary | ICD-10-CM | POA: Insufficient documentation

## 2022-06-19 DIAGNOSIS — R002 Palpitations: Secondary | ICD-10-CM | POA: Diagnosis not present

## 2022-06-19 DIAGNOSIS — R072 Precordial pain: Secondary | ICD-10-CM | POA: Insufficient documentation

## 2022-06-19 LAB — COMPREHENSIVE METABOLIC PANEL
ALT: 11 U/L (ref 0–44)
AST: 24 U/L (ref 15–41)
Albumin: 3.9 g/dL (ref 3.5–5.0)
Alkaline Phosphatase: 90 U/L (ref 38–126)
Anion gap: 11 (ref 5–15)
BUN: 15 mg/dL (ref 8–23)
CO2: 24 mmol/L (ref 22–32)
Calcium: 9 mg/dL (ref 8.9–10.3)
Chloride: 102 mmol/L (ref 98–111)
Creatinine, Ser: 0.96 mg/dL (ref 0.44–1.00)
GFR, Estimated: >60 mL/min (ref >60)
Glucose, Bld: 110 mg/dL — ABNORMAL HIGH (ref 70–99)
Potassium: 3.5 mmol/L (ref 3.5–5.1)
Sodium: 137 mmol/L (ref 135–145)
Total Bilirubin: 1 mg/dL (ref 0.3–1.2)
Total Protein: 7.6 g/dL (ref 6.5–8.1)

## 2022-06-19 LAB — CBC
HCT: 39.6 % (ref 36.0–46.0)
Hemoglobin: 12.9 g/dL (ref 12.0–15.0)
MCH: 30.9 pg (ref 26.0–34.0)
MCHC: 32.6 g/dL (ref 30.0–36.0)
MCV: 94.7 fL (ref 80.0–100.0)
Platelets: 205 10*3/uL (ref 150–400)
RBC: 4.18 MIL/uL (ref 3.87–5.11)
RDW: 12.3 % (ref 11.5–15.5)
WBC: 6.7 10*3/uL (ref 4.0–10.5)
nRBC: 0 % (ref 0.0–0.2)

## 2022-06-19 LAB — TROPONIN I (HIGH SENSITIVITY)
Troponin I (High Sensitivity): 2 ng/L (ref <18)
Troponin I (High Sensitivity): 3 ng/L (ref <18)

## 2022-06-19 LAB — LIPASE, BLOOD: Lipase: 26 U/L (ref 11–51)

## 2022-06-19 LAB — MAGNESIUM: Magnesium: 1.9 mg/dL (ref 1.7–2.4)

## 2022-06-19 LAB — BRAIN NATRIURETIC PEPTIDE: B Natriuretic Peptide: 71.1 pg/mL (ref 0.0–100.0)

## 2022-06-19 NOTE — ED Notes (Signed)
Chest pain since last night states has appointment with cards dr on wed states no n/v/, a slight bit of sob

## 2022-06-19 NOTE — ED Triage Notes (Signed)
C/o chest pain starting last night, states felt like it was "beating fast", had some shortness of breath. States symptoms have improved but still there.  States her right leg went numb for a few minutes while sitting on toilet 30 mins ago, sensation normal during triage.

## 2022-06-19 NOTE — ED Provider Notes (Signed)
Emergency Department Provider Note   I have reviewed the triage vital signs and the nursing notes.   HISTORY  Chief Complaint Chest Pain   HPI Carol [name] is a 67 y.o. female past history reviewed below presents the emergency department for evaluation of chest discomfort and palpitations.  She saw her primary care doctor a month ago with similar symptoms.  She was ultimately referred to cardiology with an appointment in 2 days from now and started on metoprolol.  She been compliant with the medicine mostly although missing doses from time to time.  Last night, she had feeling of heart racing which is keeping her from sleeping.  She feels "uncomfortable" in the chest but no specific pain.  No pleuritic pain.  No current shortness of breath.  She is not currently feeling the racing heartbeats.   She also notes that while sitting on the toilet this morning she had numbness develop in her right leg which resolved upon standing.  No current neurosymptoms.   Past Medical History:  Diagnosis Date   Anemia    Arthritis    Endometriosis    GERD (gastroesophageal reflux disease)    Hypertension    MVP (mitral valve prolapse)    PONV (postoperative nausea and vomiting)     Review of Systems  Constitutional: No fever/chills Cardiovascular: Positive chest pain and palpitations.  Respiratory: Mild shortness of breath. Gastrointestinal: No abdominal pain.  No nausea, no vomiting.  No diarrhea. Musculoskeletal: Negative for back pain. Skin: Negative for rash. Neurological: Negative for headaches.   ____________________________________________   PHYSICAL EXAM:  VITAL SIGNS: ED Triage Vitals  Enc Vitals Group     BP 06/19/22 0839 (!) 150/91     Pulse Rate 06/19/22 0839 85     Resp 06/19/22 0839 20     Temp 06/19/22 0839 98.2 F (36.8 C)     Temp Source 06/19/22 0839 Oral     SpO2 06/19/22 0839 100 %     Weight 06/19/22 0837 220 lb (99.8 kg)     Height 06/19/22 0837 5'  6" (1.676 m)   Constitutional: Alert and oriented. Well appearing and in no acute distress. Eyes: Conjunctivae are normal.  Head: Atraumatic. Nose: No congestion/rhinnorhea. Mouth/Throat: Mucous membranes are moist.   Neck: No stridor.  Cardiovascular: Normal rate, regular rhythm. Good peripheral circulation. Grossly normal heart sounds.   Respiratory: Normal respiratory effort.  No retractions. Lungs CTAB. Gastrointestinal: Soft and nontender. No distention.  Musculoskeletal: No lower extremity tenderness nor edema. No gross deformities of extremities. Neurologic:  Normal speech and language. No gross focal neurologic deficits are appreciated.  Skin:  Skin is warm, dry and intact. No rash noted.  ____________________________________________   LABS (all labs ordered are listed, but only abnormal results are displayed)  Labs Reviewed  COMPREHENSIVE METABOLIC PANEL - Abnormal; Notable for the following components:      Result Value   Glucose, Bld 110 (*)    All other components within normal limits  CBC  MAGNESIUM  BRAIN NATRIURETIC PEPTIDE  LIPASE, BLOOD  TROPONIN I (HIGH SENSITIVITY)  TROPONIN I (HIGH SENSITIVITY)   ____________________________________________  EKG   EKG Interpretation  Date/Time:  Monday June 19 2022 08:38:49 EDT Ventricular Rate:  79 PR Interval:  168 QRS Duration: 101 QT Interval:  385 QTC Calculation: 442 R Axis:   78 Text Interpretation: Sinus rhythm Confirmed by Nanda Quinton [phone]) on 06/19/2022 8:47:19 AM        ____________________________________________  RADIOLOGY  DG Chest 2 View  Result Date: 06/19/2022 CLINICAL DATA:  Provided history: Chest pain/shortness of breath. EXAM: CHEST - 2 VIEW COMPARISON:  Prior chest radiographs 08/20/2019 and earlier. FINDINGS: Heart size within normal limits. No appreciable airspace consolidation. No evidence of pleural effusion or pneumothorax. No acute osseous abnormality identified. IMPRESSION:  No evidence of acute cardiopulmonary abnormality. Electronically Signed   By: Kellie Simmering D.O.   On: 06/19/2022 08:55    ____________________________________________   PROCEDURES  Procedure(s) performed:   Procedures  None  ____________________________________________   INITIAL IMPRESSION / ASSESSMENT AND PLAN / ED COURSE  Pertinent labs & imaging results that were available during my care of the patient were reviewed by me and considered in my medical decision making (see chart for details).   This patient is Presenting for Evaluation of CP, which does require a range of treatment options, and is a complaint that involves a high risk of morbidity and mortality.  The Differential Diagnoses includes but is not exclusive to acute coronary syndrome, aortic dissection, pulmonary embolism, cardiac tamponade, community-acquired pneumonia, pericarditis, musculoskeletal chest wall pain, etc.  I did obtain Additional Historical Information from family at bedside.  I decided to review pertinent External Data, and in summary no prior heart cath or stress test for review.   Clinical Laboratory Tests Ordered, included troponin negative x 2.  BNP normal.  No leukocytosis.  No AKI.   Radiologic Tests Ordered, included CXR. I independently interpreted the images and agree with radiology interpretation.   Cardiac Monitor Tracing which shows NSR with frequent PVCs.    Social Determinants of Health Risk patient is a non-smoker.   Medical Decision Making: Summary:  Presents emergency department for evaluation of discomfort in the chest and heart palpitations keeping her awake last night.  On arrival she has sinus rhythm with frequent PVCs on monitor although not captured on EKG.  No acute ischemic changes on EKG.  Plan for screening lab work including electrolyte screening.  Presentation seems most consistent with palpitations and possible underlying arrhythmia rather than ACS or PE although both  were considered.   Reevaluation with update and discussion with patient and husband at bedside.  Troponin negative x 2.  Plan for cardiology appointment on Wednesday as scheduled with strict ED return precautions if symptoms return or worsen in the meantime. She will continue with her metoprolol as prescribed.   Considered admission but workup is overall reassuring.   Patient's presentation is most consistent with acute presentation with potential threat to life or bodily function.   Disposition: discharge   ____________________________________________  FINAL CLINICAL IMPRESSION(S) / ED DIAGNOSES  Final diagnoses:  Palpitations  Precordial chest pain    Note:  This document was prepared using Dragon voice recognition software and may include unintentional dictation errors.  Nanda Quinton, MD, [facility]    Dariella Gillihan, [facility], MD 06/19/22 [phone]

## 2022-06-19 NOTE — Discharge Instructions (Signed)
You were seen in the emergency room today with chest discomfort and heart palpitations.  Your heart rhythm is remain normal here with some occasional extra beats but nothing severe.  Please continue your metoprolol and keep your cardiology follow-up appointment.  If you develop any new or suddenly worsening symptoms please return to the emergency department or call 911 for reevaluation.

## 2022-06-21 ENCOUNTER — Ambulatory Visit (INDEPENDENT_AMBULATORY_CARE_PROVIDER_SITE_OTHER): Payer: No Typology Code available for payment source

## 2022-06-21 ENCOUNTER — Encounter: Payer: Self-pay | Admitting: Internal Medicine

## 2022-06-21 ENCOUNTER — Ambulatory Visit: Payer: No Typology Code available for payment source | Attending: Internal Medicine | Admitting: Internal Medicine

## 2022-06-21 VITALS — BP 108/82 | HR 81 | Ht 66.0 in | Wt 227.4 lb

## 2022-06-21 DIAGNOSIS — R002 Palpitations: Secondary | ICD-10-CM

## 2022-06-21 NOTE — Progress Notes (Unsigned)
Enrolled for [employer] to mail a ZIO XT long term holter monitor to the patients address on file.  

## 2022-06-21 NOTE — Patient Instructions (Signed)
Medication Instructions:  Your physician recommends that you continue on your current medications as directed. Please refer to the Current Medication list given to you today.   *If you need a refill on your cardiac medications before your next appointment, please call your pharmacy*   Lab Work: None ordered  Testing/Procedures: ZIO XT- Long Term Monitor Instructions  Your physician has requested you wear a ZIO patch monitor for 14 days.  This is a single patch monitor. [employer] supplies one patch monitor per enrollment. Additional stickers are not available. Please do not apply patch if you will be having a Nuclear Stress Test,  Echocardiogram, Cardiac CT, MRI, or Chest Xray during the period you would be wearing the  monitor. The patch cannot be worn during these tests. You cannot remove and re-apply the  ZIO XT patch monitor.  Your ZIO patch monitor will be mailed 3 day USPS to your address on file. It may take 3-5 days  to receive your monitor after you have been enrolled.  Once you have received your monitor, please review the enclosed instructions. Your monitor  has already been registered assigning a specific monitor serial # to you.  Billing and Patient Assistance Program Information  We have supplied [employer] with any of your insurance information on file for billing purposes. [employer] offers a sliding scale Patient Assistance Program for patients that do not have  insurance, or whose insurance does not completely cover the cost of the ZIO monitor.  You must apply for the Patient Assistance Program to qualify for this discounted rate.  To apply, please call [employer] at [phone], select option 4, select option 2, ask to apply for  Patient Assistance Program. Theodore Demark will ask your household income, and how many people  are in your household. They will quote your out-of-pocket cost based on that information.  [employer] will also be able to set up a 38-month, interest-free payment  plan if needed.  Applying the monitor   Shave hair from upper left chest.  Hold abrader disc by orange tab. Rub abrader in 40 strokes over the upper left chest as  indicated in your monitor instructions.  Clean area with 4 enclosed alcohol pads. Let dry.  Apply patch as indicated in monitor instructions. Patch will be placed under collarbone on left  side of chest with arrow pointing upward.  Rub patch adhesive wings for 2 minutes. Remove white label marked "1". Remove the white  label marked "2". Rub patch adhesive wings for 2 additional minutes.  While looking in a mirror, press and release button in center of patch. A small green light will  flash 3-4 times. This will be your only indicator that the monitor has been turned on.  Do not shower for the first 24 hours. You may shower after the first 24 hours.  Press the button if you feel a symptom. You will hear a small click. Record Date, Time and  Symptom in the Patient Logbook.  When you are ready to remove the patch, follow instructions on the last 2 pages of Patient  Logbook. Stick patch monitor onto the last page of Patient Logbook.  Place Patient Logbook in the blue and white box. Use locking tab on box and tape box closed  securely. The blue and white box has prepaid postage on it. Please place it in the mailbox as  soon as possible. Your physician should have your test results approximately 7 days after the  monitor has been mailed back to [facility].  Call [place] at [phone] if you have questions regarding  your ZIO XT patch monitor. Call them immediately if you see an orange light blinking on your  monitor.  If your monitor falls off in less than 4 days, contact our Monitor department at [phone].  If your monitor becomes loose or falls off after 4 days call [employer] at [phone] for suggestions on securing your monitor.   Follow-Up: At [facility], you and your health needs  are our priority.  As part of our continuing mission to provide you with exceptional heart care, we have created designated Provider Care Teams.  These Care Teams include your primary Cardiologist (physician) and Advanced Practice Providers (APPs -  Physician Assistants and Nurse Practitioners) who all work together to provide you with the care you need, when you need it.  We recommend signing up for the patient portal called "MyChart".  Sign up information is provided on this After Visit Summary.  MyChart is used to connect with patients for Virtual Visits (Telemedicine).  Patients are able to view lab/test results, encounter notes, upcoming appointments, etc.  Non-urgent messages can be sent to your provider as well.   To learn more about what you can do with MyChart, go to [email or web address].    Your next appointment:   Follow-up as needed  Provider:   Janina Mayo, MD
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
## 2022-06-24 DIAGNOSIS — R002 Palpitations: Secondary | ICD-10-CM | POA: Diagnosis not present

## 2022-07-14 ENCOUNTER — Other Ambulatory Visit: Payer: Self-pay

## 2022-07-14 DIAGNOSIS — I493 Ventricular premature depolarization: Secondary | ICD-10-CM

## 2022-07-14 MED ORDER — METOPROLOL SUCCINATE ER 25 MG PO TB24: 25.0000 mg | ORAL_TABLET | Freq: Every day | ORAL | 3 refills | Status: DC

## 2022-08-14 ENCOUNTER — Ambulatory Visit (HOSPITAL_COMMUNITY): Payer: No Typology Code available for payment source | Attending: Internal Medicine

## 2022-08-14 DIAGNOSIS — I493 Ventricular premature depolarization: Secondary | ICD-10-CM

## 2022-08-14 LAB — ECHOCARDIOGRAM COMPLETE
Area-P 1/2: 3.42 cm2
S' Lateral: 3.4 cm

## 2022-09-04 IMAGING — MG MM DIGITAL SCREENING BILAT W/ TOMO AND CAD
6 of 12 series · 6 of 36 positions shown · non-contrast
Comparison: Previous exam(s).

CLINICAL DATA: Screening.

EXAM:
DIGITAL SCREENING BILATERAL MAMMOGRAM WITH TOMOSYNTHESIS AND CAD
TECHNIQUE: Bilateral screening digital craniocaudal and mediolateral oblique
mammograms were obtained. Bilateral screening digital breast
tomosynthesis was performed. The images were evaluated with
computer-aided detection.

[R MLO synth-2D (1 of 2)]
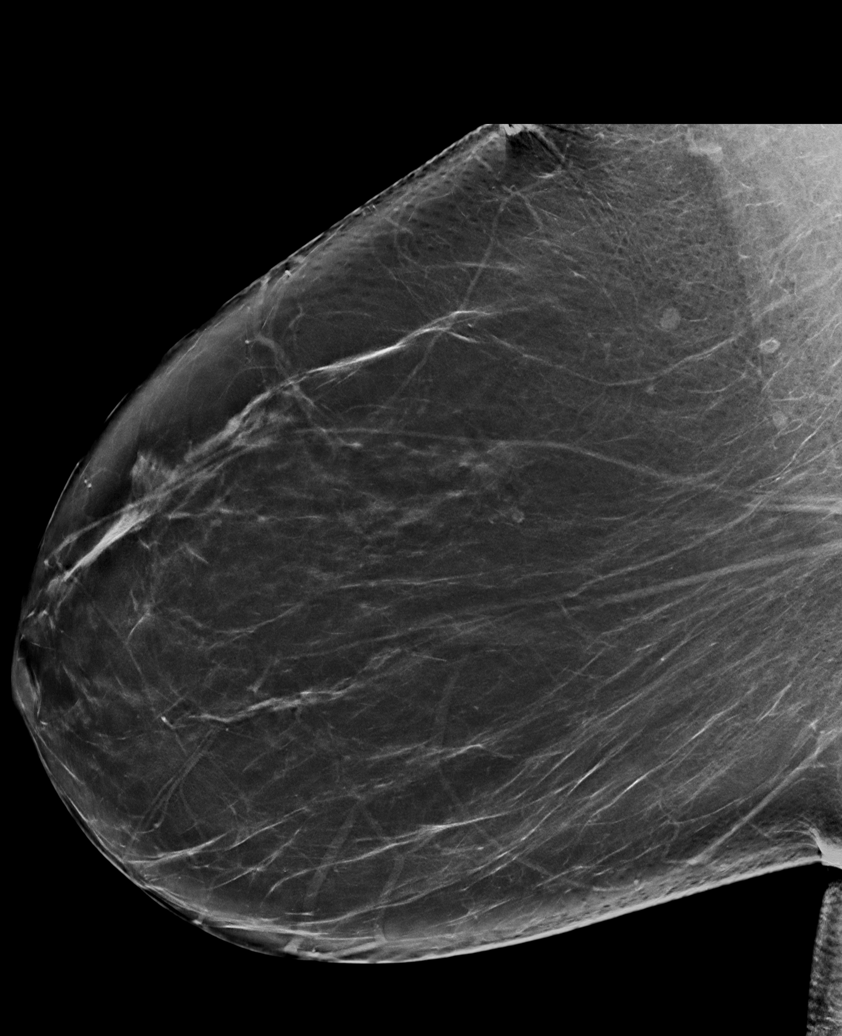

[R CC synth-2D]
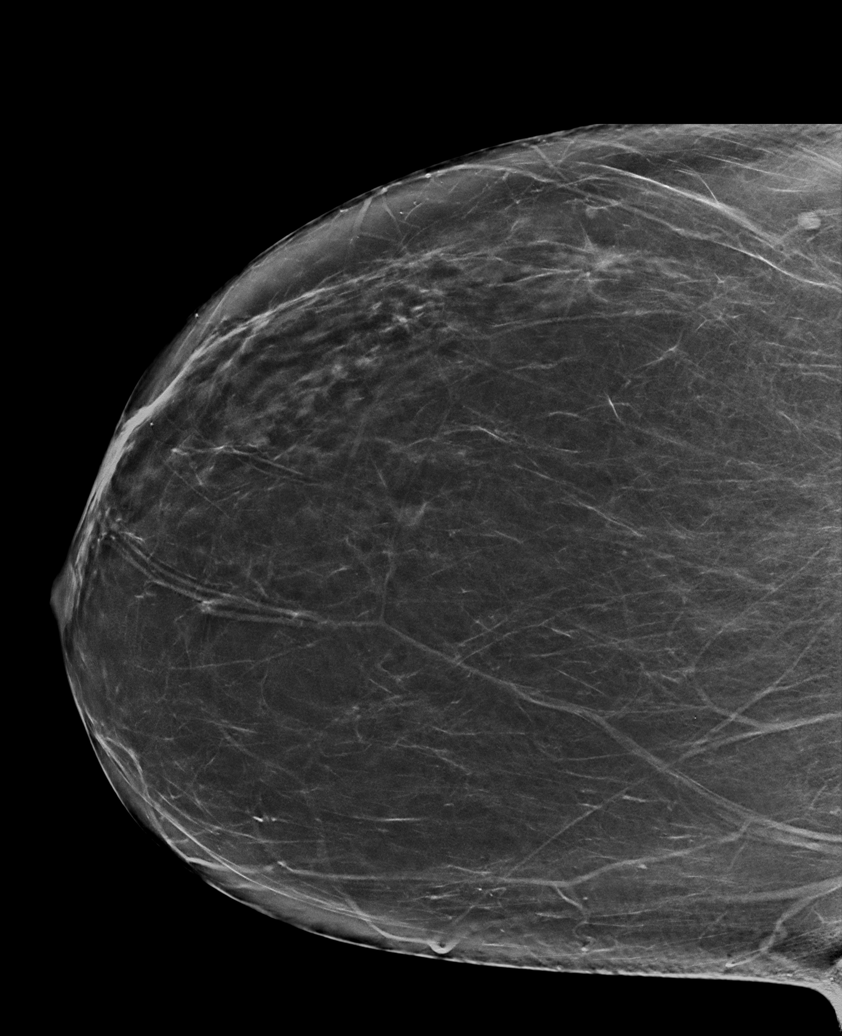

[L MLO synth-2D (1 of 2)]
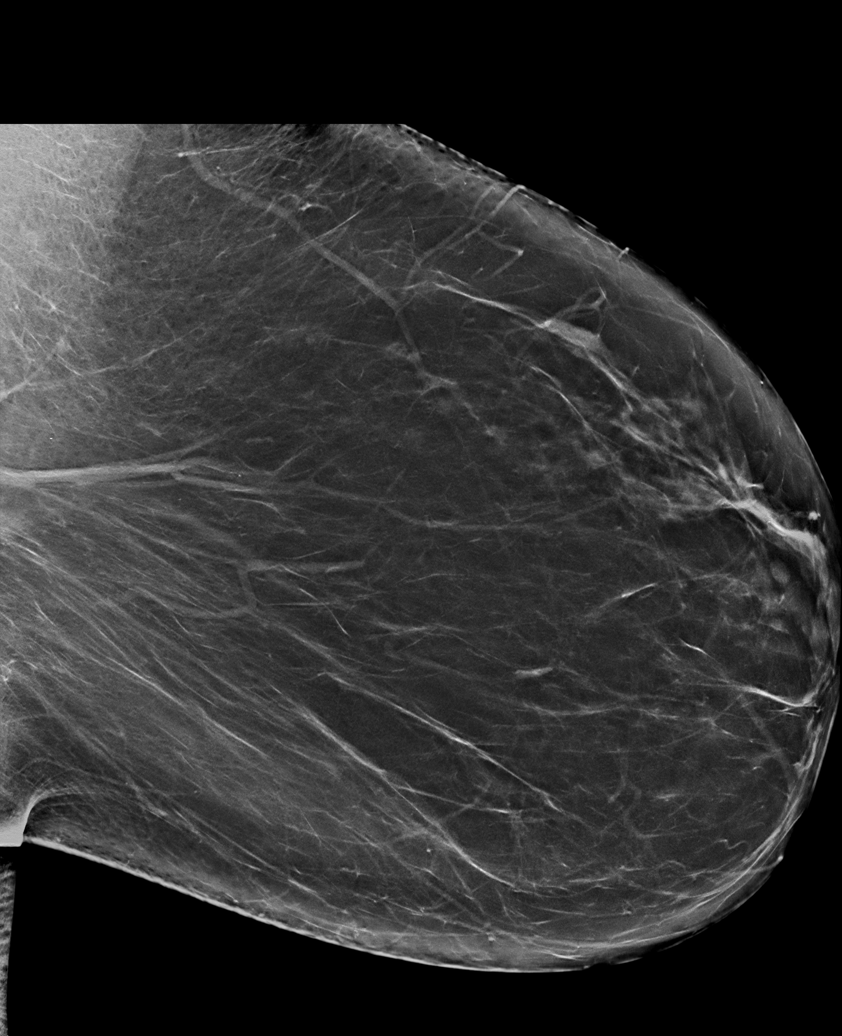

[R MLO synth-2D (2 of 2)]
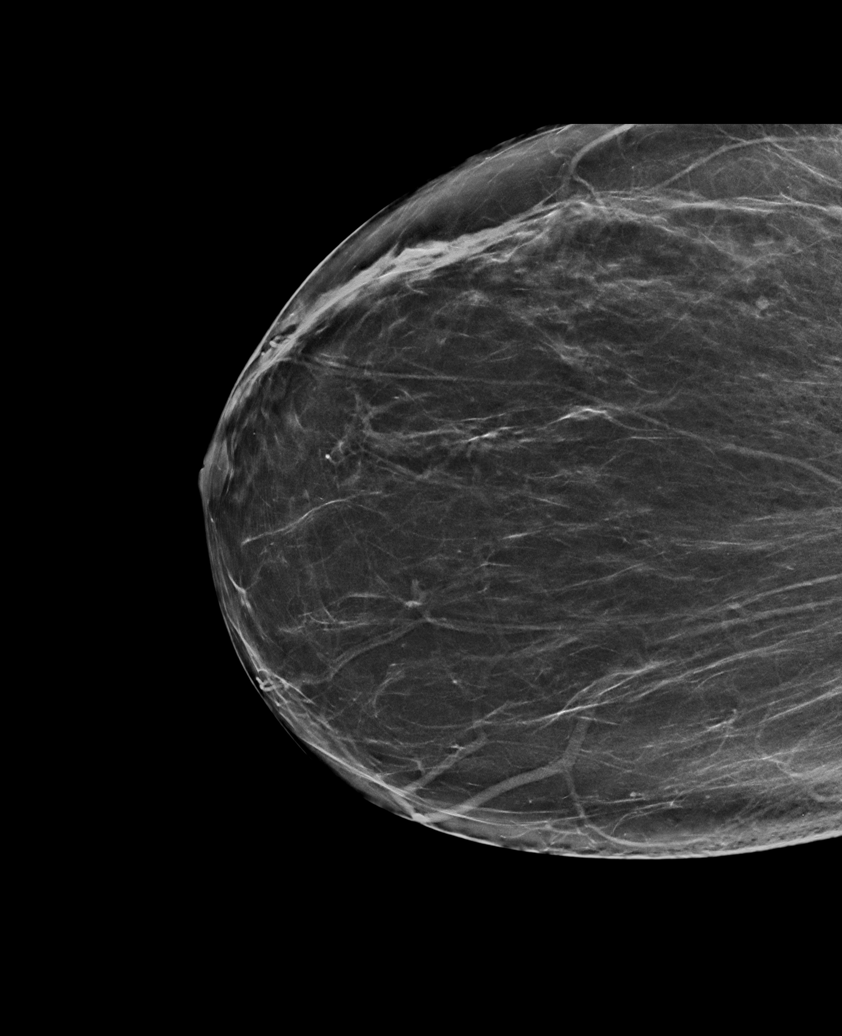

[L MLO synth-2D (2 of 2)]
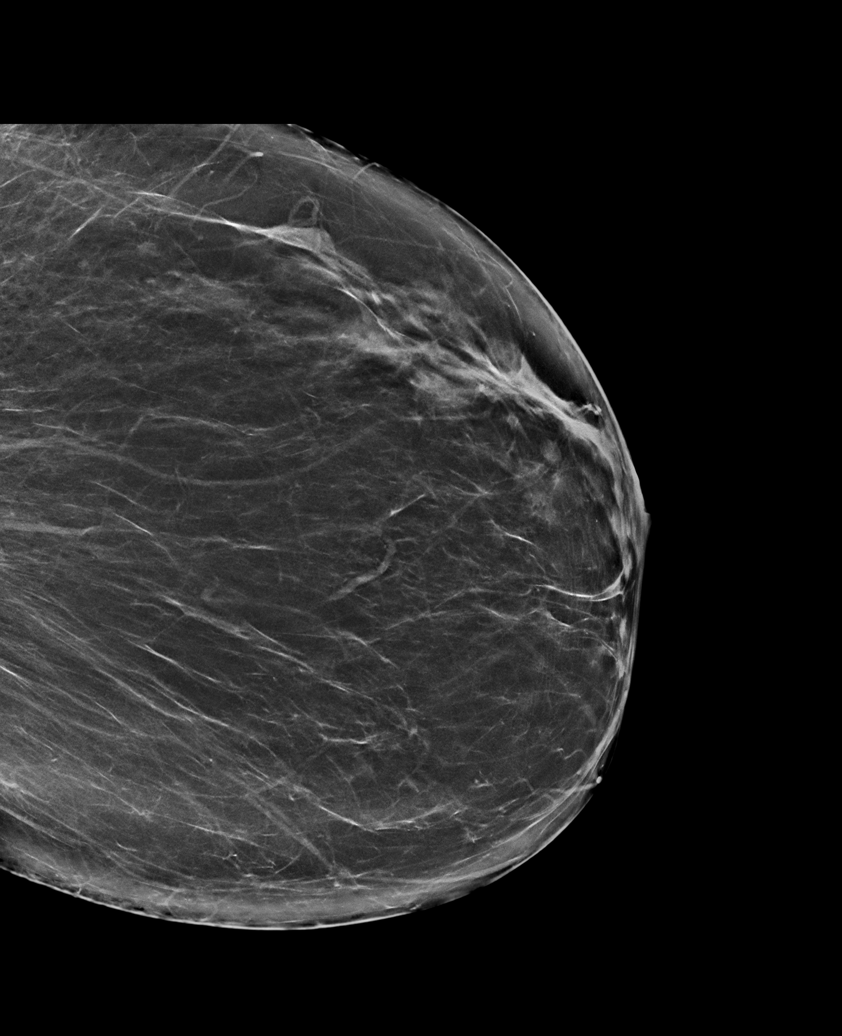

[L CC synth-2D]
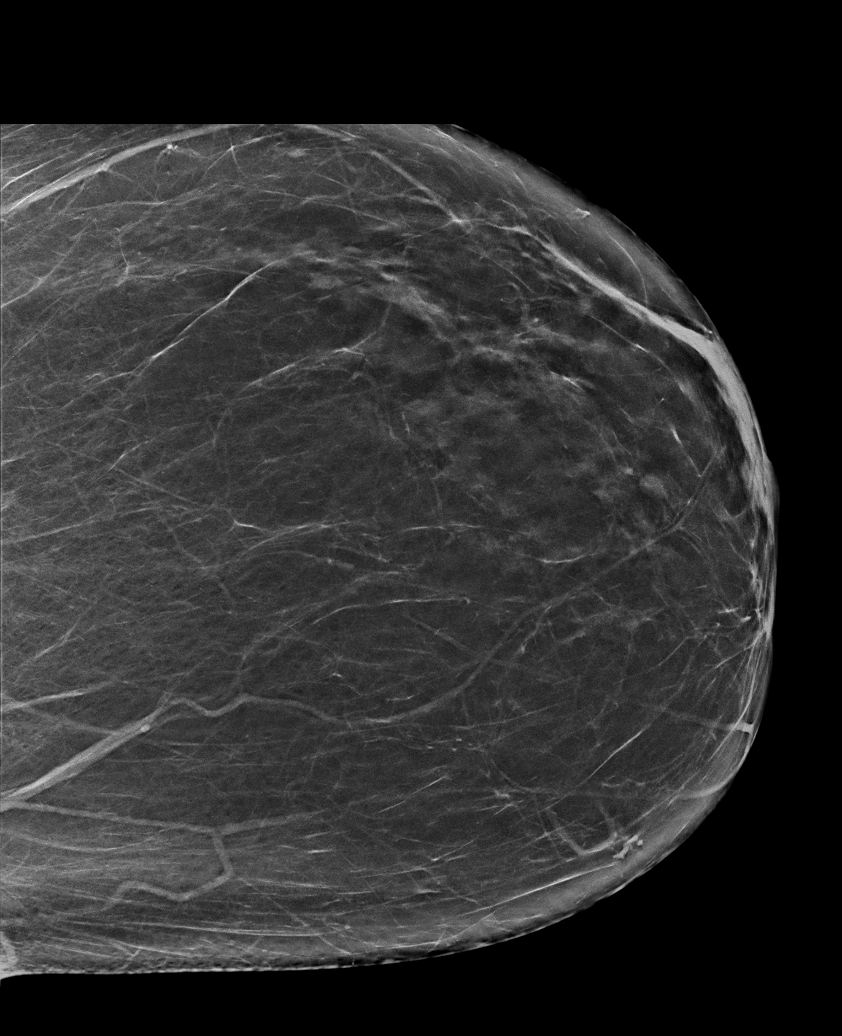

[6 of 36 positions shown; findings below may reference images not displayed]

ACR Breast Density Category b: There are scattered areas of
fibroglandular density.
FINDINGS: There are no findings suspicious for malignancy.
IMPRESSION: No mammographic evidence of malignancy. A result letter of this
screening mammogram will be mailed directly to the patient.

RECOMMENDATION:
Screening mammogram in one year. (Code:51-O-LD2)

BI-RADS CATEGORY  1: Negative.

## 2022-11-30 ENCOUNTER — Other Ambulatory Visit (HOSPITAL_BASED_OUTPATIENT_CLINIC_OR_DEPARTMENT_OTHER): Payer: Self-pay | Admitting: Physician Assistant

## 2022-11-30 DIAGNOSIS — Z1231 Encounter for screening mammogram for malignant neoplasm of breast: Secondary | ICD-10-CM

## 2022-12-11 ENCOUNTER — Encounter (HOSPITAL_BASED_OUTPATIENT_CLINIC_OR_DEPARTMENT_OTHER): Payer: Self-pay

## 2022-12-11 ENCOUNTER — Inpatient Hospital Stay (HOSPITAL_BASED_OUTPATIENT_CLINIC_OR_DEPARTMENT_OTHER): Admission: RE | Admit: 2022-12-11 | Payer: No Typology Code available for payment source | Source: Ambulatory Visit

## 2022-12-12 ENCOUNTER — Telehealth (HOSPITAL_BASED_OUTPATIENT_CLINIC_OR_DEPARTMENT_OTHER): Payer: Self-pay

## 2023-01-18 ENCOUNTER — Ambulatory Visit (HOSPITAL_BASED_OUTPATIENT_CLINIC_OR_DEPARTMENT_OTHER)
Admission: RE | Admit: 2023-01-18 | Discharge: 2023-01-18 | Disposition: A | Discharge status: Home / Self Care | Payer: No Typology Code available for payment source | Source: Ambulatory Visit | Attending: Physician Assistant | Admitting: Physician Assistant

## 2023-01-18 ENCOUNTER — Encounter (HOSPITAL_BASED_OUTPATIENT_CLINIC_OR_DEPARTMENT_OTHER): Payer: Self-pay

## 2023-01-18 DIAGNOSIS — Z1231 Encounter for screening mammogram for malignant neoplasm of breast: Secondary | ICD-10-CM | POA: Insufficient documentation

## 2023-07-22 ENCOUNTER — Other Ambulatory Visit: Payer: Self-pay | Admitting: Internal Medicine

## 2023-07-25 ENCOUNTER — Other Ambulatory Visit: Payer: Self-pay | Admitting: Internal Medicine

## 2023-09-11 ENCOUNTER — Other Ambulatory Visit: Payer: Self-pay

## 2023-09-11 MED ORDER — METOPROLOL SUCCINATE ER 25 MG PO TB24: 25.0000 mg | ORAL_TABLET | Freq: Every day | ORAL | 0 refills | Status: DC

## 2024-02-14 ENCOUNTER — Other Ambulatory Visit: Payer: Self-pay | Admitting: Physician Assistant

## 2024-03-11 ENCOUNTER — Other Ambulatory Visit (HOSPITAL_BASED_OUTPATIENT_CLINIC_OR_DEPARTMENT_OTHER): Payer: Self-pay | Admitting: Obstetrics and Gynecology

## 2024-03-11 DIAGNOSIS — E2839 Other primary ovarian failure: Secondary | ICD-10-CM

## 2024-04-23 ENCOUNTER — Ambulatory Visit (HOSPITAL_BASED_OUTPATIENT_CLINIC_OR_DEPARTMENT_OTHER)
Admission: RE | Admit: 2024-04-23 | Discharge: 2024-04-23 | Disposition: A | Discharge status: Home / Self Care | Source: Ambulatory Visit | Attending: Obstetrics and Gynecology | Admitting: Obstetrics and Gynecology

## 2024-04-23 DIAGNOSIS — E2839 Other primary ovarian failure: Secondary | ICD-10-CM | POA: Insufficient documentation
